# Patient Record
Sex: Male | Born: 1940 | Race: White | Hispanic: No
Health system: Southern US, Community
[De-identification: ages and names within clinical notes are randomized; demographics above are authoritative.]

---

## 2007-10-24 ENCOUNTER — Ambulatory Visit: Payer: Self-pay | Admitting: Neurology

## 2008-06-22 ENCOUNTER — Ambulatory Visit: Payer: Self-pay | Admitting: Internal Medicine

## 2008-06-26 ENCOUNTER — Ambulatory Visit: Payer: Self-pay | Admitting: Internal Medicine

## 2008-07-04 ENCOUNTER — Ambulatory Visit: Payer: Self-pay | Admitting: Gastroenterology

## 2010-10-20 ENCOUNTER — Ambulatory Visit: Payer: Self-pay | Admitting: Internal Medicine

## 2010-11-09 ENCOUNTER — Ambulatory Visit: Payer: Self-pay | Admitting: Internal Medicine

## 2010-12-24 ENCOUNTER — Ambulatory Visit: Payer: Self-pay | Admitting: Internal Medicine

## 2012-02-29 ENCOUNTER — Ambulatory Visit: Payer: Self-pay | Admitting: Surgery

## 2012-03-21 ENCOUNTER — Ambulatory Visit: Payer: Self-pay | Admitting: Gastroenterology

## 2012-09-27 ENCOUNTER — Ambulatory Visit: Payer: Self-pay | Admitting: Surgery

## 2012-10-03 ENCOUNTER — Ambulatory Visit: Payer: Self-pay | Admitting: Surgery

## 2013-05-18 ENCOUNTER — Other Ambulatory Visit: Payer: Self-pay

## 2013-05-18 ENCOUNTER — Inpatient Hospital Stay: Payer: Self-pay | Admitting: Internal Medicine

## 2013-05-18 LAB — COMPREHENSIVE METABOLIC PANEL
ALK PHOS: 110 U/L
Albumin: 3.3 g/dL — ABNORMAL LOW (ref 3.4–5.0)
Anion Gap: 5 — ABNORMAL LOW (ref 7–16)
BUN: 18 mg/dL (ref 7–18)
Bilirubin,Total: 1.3 mg/dL — ABNORMAL HIGH (ref 0.2–1.0)
CO2: 34 mmol/L — AB (ref 21–32)
Calcium, Total: 9.1 mg/dL (ref 8.5–10.1)
Chloride: 100 mmol/L (ref 98–107)
Creatinine: 1.21 mg/dL (ref 0.60–1.30)
EGFR (African American): >60
EGFR (Non-African Amer.): >60
GLUCOSE: 99 mg/dL (ref 65–99)
Osmolality: 279 (ref 275–301)
Potassium: 3.6 mmol/L (ref 3.5–5.1)
SGOT(AST): 27 U/L (ref 15–37)
SGPT (ALT): 29 U/L (ref 12–78)
SODIUM: 139 mmol/L (ref 136–145)
Total Protein: 7.2 g/dL (ref 6.4–8.2)

## 2013-05-18 LAB — BASIC METABOLIC PANEL
Anion Gap: 8 (ref 7–16)
BUN: 19 mg/dL — AB (ref 7–18)
CALCIUM: 8.4 mg/dL — AB (ref 8.5–10.1)
CHLORIDE: 100 mmol/L (ref 98–107)
CO2: 29 mmol/L (ref 21–32)
Creatinine: 1.21 mg/dL (ref 0.60–1.30)
EGFR (African American): >60
EGFR (Non-African Amer.): >60
Glucose: 121 mg/dL — ABNORMAL HIGH (ref 65–99)
Osmolality: 277 (ref 275–301)
POTASSIUM: 3.4 mmol/L — AB (ref 3.5–5.1)
SODIUM: 137 mmol/L (ref 136–145)

## 2013-05-18 LAB — PRO B NATRIURETIC PEPTIDE: B-TYPE NATIURETIC PEPTID: 19790 pg/mL — AB (ref 0–125)

## 2013-05-18 LAB — CBC
HCT: 39.7 % — ABNORMAL LOW (ref 40.0–52.0)
HGB: 12.5 g/dL — ABNORMAL LOW (ref 13.0–18.0)
MCH: 27.9 pg (ref 26.0–34.0)
MCHC: 31.5 g/dL — ABNORMAL LOW (ref 32.0–36.0)
MCV: 89 fL (ref 80–100)
Platelet: 156 10*3/uL (ref 150–440)
RBC: 4.47 10*6/uL (ref 4.40–5.90)
RDW: 15.9 % — ABNORMAL HIGH (ref 11.5–14.5)
WBC: 15.9 10*3/uL — ABNORMAL HIGH (ref 3.8–10.6)

## 2013-05-18 LAB — TROPONIN I: TROPONIN-I: 0.02 ng/mL

## 2013-05-18 LAB — PROTIME-INR
INR: 1.5
Prothrombin Time: 17.6 secs — ABNORMAL HIGH (ref 11.5–14.7)

## 2013-05-19 ENCOUNTER — Ambulatory Visit: Payer: Self-pay | Admitting: Neurology

## 2013-05-19 LAB — BASIC METABOLIC PANEL
Anion Gap: 5 — ABNORMAL LOW (ref 7–16)
BUN: 17 mg/dL (ref 7–18)
CO2: 33 mmol/L — AB (ref 21–32)
Calcium, Total: 8.7 mg/dL (ref 8.5–10.1)
Chloride: 101 mmol/L (ref 98–107)
Creatinine: 1.14 mg/dL (ref 0.60–1.30)
GLUCOSE: 134 mg/dL — AB (ref 65–99)
OSMOLALITY: 281 (ref 275–301)
Potassium: 3.4 mmol/L — ABNORMAL LOW (ref 3.5–5.1)
SODIUM: 139 mmol/L (ref 136–145)

## 2013-05-19 LAB — MAGNESIUM
Magnesium: 2.2 mg/dL
Magnesium: 2.2 mg/dL

## 2013-05-19 LAB — TSH: Thyroid Stimulating Horm: 1.58 u[IU]/mL

## 2013-05-19 LAB — TROPONIN I: Troponin-I: <0.02 ng/mL

## 2013-05-20 LAB — BASIC METABOLIC PANEL
ANION GAP: 6 — AB (ref 7–16)
BUN: 18 mg/dL (ref 7–18)
CHLORIDE: 104 mmol/L (ref 98–107)
CREATININE: 1.06 mg/dL (ref 0.60–1.30)
Calcium, Total: 8.8 mg/dL (ref 8.5–10.1)
Co2: 31 mmol/L (ref 21–32)
EGFR (Non-African Amer.): >60
Glucose: 98 mg/dL (ref 65–99)
Osmolality: 283 (ref 275–301)
Potassium: 3.3 mmol/L — ABNORMAL LOW (ref 3.5–5.1)
Sodium: 141 mmol/L (ref 136–145)

## 2013-05-20 LAB — CBC WITH DIFFERENTIAL/PLATELET
BASOS ABS: 0 10*3/uL (ref 0.0–0.1)
BASOS PCT: 0.3 %
EOS ABS: 0 10*3/uL (ref 0.0–0.7)
Eosinophil %: 0.4 %
HCT: 37.4 % — AB (ref 40.0–52.0)
HGB: 12.2 g/dL — ABNORMAL LOW (ref 13.0–18.0)
LYMPHS PCT: 9 %
Lymphocyte #: 1 10*3/uL (ref 1.0–3.6)
MCH: 28.9 pg (ref 26.0–34.0)
MCHC: 32.6 g/dL (ref 32.0–36.0)
MCV: 89 fL (ref 80–100)
MONOS PCT: 5.2 %
Monocyte #: 0.6 x10 3/mm (ref 0.2–1.0)
Neutrophil #: 9.8 10*3/uL — ABNORMAL HIGH (ref 1.4–6.5)
Neutrophil %: 85.1 %
Platelet: 130 10*3/uL — ABNORMAL LOW (ref 150–440)
RBC: 4.22 10*6/uL — ABNORMAL LOW (ref 4.40–5.90)
RDW: 16 % — AB (ref 11.5–14.5)
WBC: 11.5 10*3/uL — ABNORMAL HIGH (ref 3.8–10.6)

## 2013-05-21 LAB — CBC WITH DIFFERENTIAL/PLATELET
BASOS ABS: 0 10*3/uL (ref 0.0–0.1)
Basophil %: 0.4 %
Eosinophil #: 0.2 10*3/uL (ref 0.0–0.7)
Eosinophil %: 1.9 %
HCT: 37.8 % — AB (ref 40.0–52.0)
HGB: 12.2 g/dL — AB (ref 13.0–18.0)
LYMPHS ABS: 0.9 10*3/uL — AB (ref 1.0–3.6)
Lymphocyte %: 8.6 %
MCH: 29 pg (ref 26.0–34.0)
MCHC: 32.4 g/dL (ref 32.0–36.0)
MCV: 90 fL (ref 80–100)
MONO ABS: 0.6 x10 3/mm (ref 0.2–1.0)
Monocyte %: 5.4 %
Neutrophil #: 9.1 10*3/uL — ABNORMAL HIGH (ref 1.4–6.5)
Neutrophil %: 83.7 %
Platelet: 127 10*3/uL — ABNORMAL LOW (ref 150–440)
RBC: 4.23 10*6/uL — ABNORMAL LOW (ref 4.40–5.90)
RDW: 16 % — ABNORMAL HIGH (ref 11.5–14.5)
WBC: 10.8 10*3/uL — ABNORMAL HIGH (ref 3.8–10.6)

## 2013-05-21 LAB — BASIC METABOLIC PANEL
ANION GAP: 5 — AB (ref 7–16)
BUN: 13 mg/dL (ref 7–18)
CREATININE: 0.98 mg/dL (ref 0.60–1.30)
Calcium, Total: 8.9 mg/dL (ref 8.5–10.1)
Chloride: 105 mmol/L (ref 98–107)
Co2: 31 mmol/L (ref 21–32)
EGFR (African American): >60
GLUCOSE: 94 mg/dL (ref 65–99)
Osmolality: 281 (ref 275–301)
Potassium: 3.8 mmol/L (ref 3.5–5.1)
Sodium: 141 mmol/L (ref 136–145)

## 2013-05-22 LAB — CBC WITH DIFFERENTIAL/PLATELET
BASOS ABS: 0 10*3/uL (ref 0.0–0.1)
Basophil %: 0 %
EOS PCT: 0 %
Eosinophil #: 0 10*3/uL (ref 0.0–0.7)
HCT: 38.8 % — AB (ref 40.0–52.0)
HGB: 12.5 g/dL — ABNORMAL LOW (ref 13.0–18.0)
Lymphocyte #: 0.4 10*3/uL — ABNORMAL LOW (ref 1.0–3.6)
Lymphocyte %: 4.7 %
MCH: 28.8 pg (ref 26.0–34.0)
MCHC: 32.3 g/dL (ref 32.0–36.0)
MCV: 89 fL (ref 80–100)
MONO ABS: 0.2 x10 3/mm (ref 0.2–1.0)
MONOS PCT: 2 %
NEUTROS ABS: 7.6 10*3/uL — AB (ref 1.4–6.5)
NEUTROS PCT: 93.3 %
PLATELETS: 117 10*3/uL — AB (ref 150–440)
RBC: 4.34 10*6/uL — AB (ref 4.40–5.90)
RDW: 16.3 % — AB (ref 11.5–14.5)
WBC: 8.1 10*3/uL (ref 3.8–10.6)

## 2013-05-22 LAB — BASIC METABOLIC PANEL
ANION GAP: 6 — AB (ref 7–16)
BUN: 16 mg/dL (ref 7–18)
CALCIUM: 8.9 mg/dL (ref 8.5–10.1)
CHLORIDE: 105 mmol/L (ref 98–107)
CO2: 30 mmol/L (ref 21–32)
Creatinine: 0.97 mg/dL (ref 0.60–1.30)
EGFR (Non-African Amer.): >60
Glucose: 146 mg/dL — ABNORMAL HIGH (ref 65–99)
OSMOLALITY: 285 (ref 275–301)
POTASSIUM: 4.6 mmol/L (ref 3.5–5.1)
Sodium: 141 mmol/L (ref 136–145)

## 2013-05-23 LAB — BASIC METABOLIC PANEL
ANION GAP: 5 — AB (ref 7–16)
BUN: 23 mg/dL — ABNORMAL HIGH (ref 7–18)
CALCIUM: 9.2 mg/dL (ref 8.5–10.1)
CHLORIDE: 105 mmol/L (ref 98–107)
Co2: 30 mmol/L (ref 21–32)
Creatinine: 0.98 mg/dL (ref 0.60–1.30)
EGFR (Non-African Amer.): >60
Glucose: 163 mg/dL — ABNORMAL HIGH (ref 65–99)
Osmolality: 287 (ref 275–301)
POTASSIUM: 4.5 mmol/L (ref 3.5–5.1)
SODIUM: 140 mmol/L (ref 136–145)

## 2013-05-24 ENCOUNTER — Observation Stay: Payer: Self-pay | Admitting: Internal Medicine

## 2013-05-24 LAB — CBC WITH DIFFERENTIAL/PLATELET
BASOS ABS: 0 10*3/uL (ref 0.0–0.1)
Basophil %: 0.2 %
EOS PCT: 0 %
Eosinophil #: 0 10*3/uL (ref 0.0–0.7)
HCT: 44 % (ref 40.0–52.0)
HGB: 13.7 g/dL (ref 13.0–18.0)
Lymphocyte #: 0.3 10*3/uL — ABNORMAL LOW (ref 1.0–3.6)
Lymphocyte %: 2.2 %
MCH: 27.9 pg (ref 26.0–34.0)
MCHC: 31.2 g/dL — ABNORMAL LOW (ref 32.0–36.0)
MCV: 89 fL (ref 80–100)
Monocyte #: 0.4 x10 3/mm (ref 0.2–1.0)
Monocyte %: 2.6 %
NEUTROS ABS: 13.9 10*3/uL — AB (ref 1.4–6.5)
Neutrophil %: 95 %
Platelet: 156 10*3/uL (ref 150–440)
RBC: 4.92 10*6/uL (ref 4.40–5.90)
RDW: 16.6 % — AB (ref 11.5–14.5)
WBC: 14.7 10*3/uL — AB (ref 3.8–10.6)

## 2013-05-24 LAB — BASIC METABOLIC PANEL
Anion Gap: 5 — ABNORMAL LOW (ref 7–16)
BUN: 28 mg/dL — ABNORMAL HIGH (ref 7–18)
CHLORIDE: 103 mmol/L (ref 98–107)
Calcium, Total: 9.7 mg/dL (ref 8.5–10.1)
Co2: 32 mmol/L (ref 21–32)
Creatinine: 0.84 mg/dL (ref 0.60–1.30)
EGFR (African American): >60
EGFR (Non-African Amer.): >60
GLUCOSE: 173 mg/dL — AB (ref 65–99)
Osmolality: 289 (ref 275–301)
POTASSIUM: 4.2 mmol/L (ref 3.5–5.1)
SODIUM: 140 mmol/L (ref 136–145)

## 2013-05-24 LAB — URINALYSIS, COMPLETE
BACTERIA: NONE SEEN
Bilirubin,UR: NEGATIVE
Blood: NEGATIVE
Glucose,UR: NEGATIVE mg/dL (ref 0–75)
Ketone: NEGATIVE
Leukocyte Esterase: NEGATIVE
NITRITE: NEGATIVE
PH: 6 (ref 4.5–8.0)
RBC,UR: <1 /HPF (ref 0–5)
Specific Gravity: 1.01 (ref 1.003–1.030)
Squamous Epithelial: <1
WBC UR: 2 /HPF (ref 0–5)

## 2013-05-24 LAB — PRO B NATRIURETIC PEPTIDE: B-Type Natriuretic Peptide: 16783 pg/mL — ABNORMAL HIGH (ref 0–125)

## 2013-05-24 LAB — TROPONIN I
TROPONIN-I: 0.04 ng/mL
Troponin-I: 0.05 ng/mL

## 2013-05-24 LAB — CK-MB
CK-MB: 2.3 ng/mL (ref 0.5–3.6)
CK-MB: 2 ng/mL (ref 0.5–3.6)

## 2013-05-24 LAB — MAGNESIUM: MAGNESIUM: 2.5 mg/dL — AB

## 2013-05-24 LAB — LIPASE, BLOOD: Lipase: 144 U/L (ref 73–393)

## 2013-05-25 LAB — BASIC METABOLIC PANEL
ANION GAP: 5 — AB (ref 7–16)
BUN: 26 mg/dL — AB (ref 7–18)
CALCIUM: 9.2 mg/dL (ref 8.5–10.1)
CHLORIDE: 106 mmol/L (ref 98–107)
Co2: 30 mmol/L (ref 21–32)
Creatinine: 0.79 mg/dL (ref 0.60–1.30)
EGFR (African American): >60
Glucose: 166 mg/dL — ABNORMAL HIGH (ref 65–99)
Osmolality: 290 (ref 275–301)
POTASSIUM: 4.1 mmol/L (ref 3.5–5.1)
Sodium: 141 mmol/L (ref 136–145)

## 2013-05-25 LAB — CBC WITH DIFFERENTIAL/PLATELET
Basophil #: 0 10*3/uL (ref 0.0–0.1)
Basophil %: 0.1 %
EOS ABS: 0 10*3/uL (ref 0.0–0.7)
Eosinophil %: 0 %
HCT: 44 % (ref 40.0–52.0)
HGB: 13.7 g/dL (ref 13.0–18.0)
LYMPHS ABS: 0.4 10*3/uL — AB (ref 1.0–3.6)
LYMPHS PCT: 2.3 %
MCH: 28.1 pg (ref 26.0–34.0)
MCHC: 31.2 g/dL — AB (ref 32.0–36.0)
MCV: 90 fL (ref 80–100)
Monocyte #: 0.5 x10 3/mm (ref 0.2–1.0)
Monocyte %: 3.4 %
NEUTROS PCT: 94.2 %
Neutrophil #: 14.1 10*3/uL — ABNORMAL HIGH (ref 1.4–6.5)
PLATELETS: 146 10*3/uL — AB (ref 150–440)
RBC: 4.88 10*6/uL (ref 4.40–5.90)
RDW: 16.3 % — AB (ref 11.5–14.5)
WBC: 15 10*3/uL — ABNORMAL HIGH (ref 3.8–10.6)

## 2013-05-25 LAB — TROPONIN I: Troponin-I: 0.06 ng/mL — ABNORMAL HIGH

## 2013-05-25 LAB — MAGNESIUM: Magnesium: 2.4 mg/dL

## 2013-05-25 LAB — CK-MB: CK-MB: 1.8 ng/mL (ref 0.5–3.6)

## 2013-05-26 LAB — COMPREHENSIVE METABOLIC PANEL
ANION GAP: 12 (ref 7–16)
Albumin: 3.6 g/dL (ref 3.4–5.0)
Alkaline Phosphatase: 121 U/L — ABNORMAL HIGH
BILIRUBIN TOTAL: 1.1 mg/dL — AB (ref 0.2–1.0)
BUN: 40 mg/dL — ABNORMAL HIGH (ref 7–18)
CO2: 28 mmol/L (ref 21–32)
CREATININE: 1.13 mg/dL (ref 0.60–1.30)
Calcium, Total: 9.1 mg/dL (ref 8.5–10.1)
Chloride: 104 mmol/L (ref 98–107)
EGFR (Non-African Amer.): >60
Glucose: 150 mg/dL — ABNORMAL HIGH (ref 65–99)
OSMOLALITY: 299 (ref 275–301)
Potassium: 3.6 mmol/L (ref 3.5–5.1)
SGOT(AST): 125 U/L — ABNORMAL HIGH (ref 15–37)
SGPT (ALT): 178 U/L — ABNORMAL HIGH (ref 12–78)
SODIUM: 144 mmol/L (ref 136–145)
Total Protein: 7.4 g/dL (ref 6.4–8.2)

## 2013-05-26 LAB — APTT: Activated PTT: 26.9 secs (ref 23.6–35.9)

## 2013-05-26 LAB — CBC
HCT: 44.7 % (ref 40.0–52.0)
HGB: 13.9 g/dL (ref 13.0–18.0)
MCH: 27.9 pg (ref 26.0–34.0)
MCHC: 31 g/dL — ABNORMAL LOW (ref 32.0–36.0)
MCV: 90 fL (ref 80–100)
PLATELETS: 149 10*3/uL — AB (ref 150–440)
RBC: 4.97 10*6/uL (ref 4.40–5.90)
RDW: 16.5 % — ABNORMAL HIGH (ref 11.5–14.5)
WBC: 18.6 10*3/uL — ABNORMAL HIGH (ref 3.8–10.6)

## 2013-05-26 LAB — URINALYSIS, COMPLETE
Bacteria: NONE SEEN
Bilirubin,UR: NEGATIVE
Glucose,UR: NEGATIVE mg/dL (ref 0–75)
Hyaline Cast: 14
Ketone: NEGATIVE
LEUKOCYTE ESTERASE: NEGATIVE
Nitrite: NEGATIVE
Ph: 5 (ref 4.5–8.0)
Protein: NEGATIVE
SQUAMOUS EPITHELIAL: NONE SEEN
Specific Gravity: 1.009 (ref 1.003–1.030)

## 2013-05-26 LAB — PROTIME-INR
INR: 1.5
Prothrombin Time: 17.7 secs — ABNORMAL HIGH (ref 11.5–14.7)

## 2013-05-26 LAB — AMMONIA: Ammonia, Plasma: 35 mcmol/L — ABNORMAL HIGH (ref 11–32)

## 2013-05-26 LAB — PRO B NATRIURETIC PEPTIDE: B-Type Natriuretic Peptide: 21509 pg/mL — ABNORMAL HIGH (ref 0–125)

## 2013-05-27 ENCOUNTER — Inpatient Hospital Stay: Payer: Self-pay | Admitting: Internal Medicine

## 2013-05-27 LAB — CBC WITH DIFFERENTIAL/PLATELET
BASOS ABS: 0 10*3/uL (ref 0.0–0.1)
BASOS PCT: 0.1 %
EOS ABS: 0.2 10*3/uL (ref 0.0–0.7)
EOS PCT: 1.2 %
HCT: 41.4 % (ref 40.0–52.0)
HGB: 12.9 g/dL — ABNORMAL LOW (ref 13.0–18.0)
Lymphocyte #: 0.9 10*3/uL — ABNORMAL LOW (ref 1.0–3.6)
Lymphocyte %: 6.5 %
MCH: 27.7 pg (ref 26.0–34.0)
MCHC: 31.3 g/dL — ABNORMAL LOW (ref 32.0–36.0)
MCV: 89 fL (ref 80–100)
Monocyte #: 0.4 x10 3/mm (ref 0.2–1.0)
Monocyte %: 3.3 %
NEUTROS ABS: 11.9 10*3/uL — AB (ref 1.4–6.5)
Neutrophil %: 88.9 %
PLATELETS: 129 10*3/uL — AB (ref 150–440)
RBC: 4.66 10*6/uL (ref 4.40–5.90)
RDW: 16.9 % — ABNORMAL HIGH (ref 11.5–14.5)
WBC: 13.3 10*3/uL — ABNORMAL HIGH (ref 3.8–10.6)

## 2013-05-27 LAB — CK-MB
CK-MB: 1.8 ng/mL (ref 0.5–3.6)
CK-MB: 2.2 ng/mL (ref 0.5–3.6)
CK-MB: 2.6 ng/mL (ref 0.5–3.6)

## 2013-05-27 LAB — BASIC METABOLIC PANEL
ANION GAP: 8 (ref 7–16)
BUN: 38 mg/dL — ABNORMAL HIGH (ref 7–18)
CO2: 31 mmol/L (ref 21–32)
Calcium, Total: 8.8 mg/dL (ref 8.5–10.1)
Chloride: 106 mmol/L (ref 98–107)
Creatinine: 1 mg/dL (ref 0.60–1.30)
EGFR (African American): >60
EGFR (Non-African Amer.): >60
Glucose: 98 mg/dL (ref 65–99)
Osmolality: 298 (ref 275–301)
Potassium: 3.1 mmol/L — ABNORMAL LOW (ref 3.5–5.1)
SODIUM: 145 mmol/L (ref 136–145)

## 2013-05-27 LAB — TROPONIN I
TROPONIN-I: 0.12 ng/mL — AB
Troponin-I: 0.08 ng/mL — ABNORMAL HIGH
Troponin-I: 0.11 ng/mL — ABNORMAL HIGH

## 2013-05-27 LAB — HEPATIC FUNCTION PANEL A (ARMC)
ALBUMIN: 3.3 g/dL — AB (ref 3.4–5.0)
ALK PHOS: 122 U/L — AB
BILIRUBIN TOTAL: 0.8 mg/dL (ref 0.2–1.0)
Bilirubin, Direct: 0.5 mg/dL — ABNORMAL HIGH (ref 0.00–0.20)
SGOT(AST): 99 U/L — ABNORMAL HIGH (ref 15–37)
SGPT (ALT): 165 U/L — ABNORMAL HIGH (ref 12–78)
Total Protein: 6.7 g/dL (ref 6.4–8.2)

## 2013-05-28 LAB — CBC WITH DIFFERENTIAL/PLATELET
Basophil #: 0.1 10*3/uL (ref 0.0–0.1)
Basophil %: 0.5 %
Eosinophil #: 0.4 10*3/uL (ref 0.0–0.7)
Eosinophil %: 3 %
HCT: 42.2 % (ref 40.0–52.0)
HGB: 13.4 g/dL (ref 13.0–18.0)
LYMPHS ABS: 1 10*3/uL (ref 1.0–3.6)
LYMPHS PCT: 7.3 %
MCH: 28.5 pg (ref 26.0–34.0)
MCHC: 31.9 g/dL — AB (ref 32.0–36.0)
MCV: 89 fL (ref 80–100)
Monocyte #: 0.6 x10 3/mm (ref 0.2–1.0)
Monocyte %: 4.1 %
Neutrophil #: 11.8 10*3/uL — ABNORMAL HIGH (ref 1.4–6.5)
Neutrophil %: 85.1 %
Platelet: 118 10*3/uL — ABNORMAL LOW (ref 150–440)
RBC: 4.72 10*6/uL (ref 4.40–5.90)
RDW: 16.7 % — ABNORMAL HIGH (ref 11.5–14.5)
WBC: 13.9 10*3/uL — AB (ref 3.8–10.6)

## 2013-05-28 LAB — HEPATIC FUNCTION PANEL A (ARMC)
ALK PHOS: 97 U/L
ALT: 145 U/L — AB (ref 12–78)
Albumin: 2.9 g/dL — ABNORMAL LOW (ref 3.4–5.0)
BILIRUBIN DIRECT: 0.4 mg/dL — AB (ref 0.00–0.20)
Bilirubin,Total: 1.3 mg/dL — ABNORMAL HIGH (ref 0.2–1.0)
SGOT(AST): 79 U/L — ABNORMAL HIGH (ref 15–37)
Total Protein: 6.4 g/dL (ref 6.4–8.2)

## 2013-05-29 LAB — CBC WITH DIFFERENTIAL/PLATELET
Basophil #: 0 10*3/uL (ref 0.0–0.1)
Basophil %: 0.1 %
EOS ABS: 0.4 10*3/uL (ref 0.0–0.7)
Eosinophil %: 3.3 %
HCT: 40.9 % (ref 40.0–52.0)
HGB: 13.3 g/dL (ref 13.0–18.0)
Lymphocyte #: 1.1 10*3/uL (ref 1.0–3.6)
Lymphocyte %: 8.2 %
MCH: 28.8 pg (ref 26.0–34.0)
MCHC: 32.6 g/dL (ref 32.0–36.0)
MCV: 88 fL (ref 80–100)
MONO ABS: 0.5 x10 3/mm (ref 0.2–1.0)
Monocyte %: 4 %
NEUTROS PCT: 84.4 %
Neutrophil #: 10.9 10*3/uL — ABNORMAL HIGH (ref 1.4–6.5)
Platelet: 107 10*3/uL — ABNORMAL LOW (ref 150–440)
RBC: 4.63 10*6/uL (ref 4.40–5.90)
RDW: 16.3 % — AB (ref 11.5–14.5)
WBC: 12.9 10*3/uL — ABNORMAL HIGH (ref 3.8–10.6)

## 2013-05-29 LAB — BASIC METABOLIC PANEL
Anion Gap: 4 — ABNORMAL LOW (ref 7–16)
BUN: 20 mg/dL — ABNORMAL HIGH (ref 7–18)
Calcium, Total: 8.4 mg/dL — ABNORMAL LOW (ref 8.5–10.1)
Chloride: 106 mmol/L (ref 98–107)
Co2: 35 mmol/L — ABNORMAL HIGH (ref 21–32)
Creatinine: 1.01 mg/dL (ref 0.60–1.30)
EGFR (African American): >60
EGFR (Non-African Amer.): >60
GLUCOSE: 98 mg/dL (ref 65–99)
OSMOLALITY: 291 (ref 275–301)
POTASSIUM: 3.3 mmol/L — AB (ref 3.5–5.1)
SODIUM: 145 mmol/L (ref 136–145)

## 2013-05-29 LAB — HEPATIC FUNCTION PANEL A (ARMC)
ALBUMIN: 3.1 g/dL — AB (ref 3.4–5.0)
ALK PHOS: 97 U/L
AST: 54 U/L — AB (ref 15–37)
BILIRUBIN DIRECT: 0.5 mg/dL — AB (ref 0.00–0.20)
Bilirubin,Total: 1.3 mg/dL — ABNORMAL HIGH (ref 0.2–1.0)
SGPT (ALT): 112 U/L — ABNORMAL HIGH (ref 12–78)
Total Protein: 6.3 g/dL — ABNORMAL LOW (ref 6.4–8.2)

## 2013-05-30 LAB — CBC WITH DIFFERENTIAL/PLATELET
BASOS ABS: 0 10*3/uL (ref 0.0–0.1)
Basophil %: 0.1 %
Eosinophil #: 0.3 10*3/uL (ref 0.0–0.7)
Eosinophil %: 2.6 %
HCT: 40.2 % (ref 40.0–52.0)
HGB: 13 g/dL (ref 13.0–18.0)
Lymphocyte #: 1.1 10*3/uL (ref 1.0–3.6)
Lymphocyte %: 9.9 %
MCH: 28.8 pg (ref 26.0–34.0)
MCHC: 32.3 g/dL (ref 32.0–36.0)
MCV: 89 fL (ref 80–100)
MONOS PCT: 4.5 %
Monocyte #: 0.5 x10 3/mm (ref 0.2–1.0)
Neutrophil #: 9.5 10*3/uL — ABNORMAL HIGH (ref 1.4–6.5)
Neutrophil %: 82.9 %
PLATELETS: 103 10*3/uL — AB (ref 150–440)
RBC: 4.51 10*6/uL (ref 4.40–5.90)
RDW: 17 % — ABNORMAL HIGH (ref 11.5–14.5)
WBC: 11.5 10*3/uL — ABNORMAL HIGH (ref 3.8–10.6)

## 2013-05-30 LAB — BASIC METABOLIC PANEL
ANION GAP: 2 — AB (ref 7–16)
BUN: 18 mg/dL (ref 7–18)
CREATININE: 1.09 mg/dL (ref 0.60–1.30)
Calcium, Total: 8.9 mg/dL (ref 8.5–10.1)
Chloride: 106 mmol/L (ref 98–107)
Co2: 33 mmol/L — ABNORMAL HIGH (ref 21–32)
EGFR (African American): >60
EGFR (Non-African Amer.): >60
GLUCOSE: 130 mg/dL — AB (ref 65–99)
Osmolality: 285 (ref 275–301)
Potassium: 3.9 mmol/L (ref 3.5–5.1)
Sodium: 141 mmol/L (ref 136–145)

## 2013-05-30 LAB — HEPATIC FUNCTION PANEL A (ARMC)
ALK PHOS: 115 U/L
ALT: 96 U/L — AB (ref 12–78)
AST: 46 U/L — AB (ref 15–37)
Albumin: 3.2 g/dL — ABNORMAL LOW (ref 3.4–5.0)
BILIRUBIN DIRECT: 0.5 mg/dL — AB (ref 0.00–0.20)
BILIRUBIN TOTAL: 1.1 mg/dL — AB (ref 0.2–1.0)
TOTAL PROTEIN: 6.9 g/dL (ref 6.4–8.2)

## 2013-05-31 LAB — CULTURE, BLOOD (SINGLE)

## 2013-05-31 LAB — PATHOLOGY REPORT

## 2013-06-08 ENCOUNTER — Other Ambulatory Visit: Payer: Self-pay | Admitting: Family Medicine

## 2013-06-08 LAB — URINALYSIS, COMPLETE
BILIRUBIN, UR: NEGATIVE
Bacteria: NONE SEEN
Blood: NEGATIVE
Glucose,UR: NEGATIVE mg/dL (ref 0–75)
Hyaline Cast: 2
KETONE: NEGATIVE
NITRITE: NEGATIVE
PROTEIN: NEGATIVE
Ph: 5 (ref 4.5–8.0)
RBC,UR: 1 /HPF (ref 0–5)
SPECIFIC GRAVITY: 1.012 (ref 1.003–1.030)
Squamous Epithelial: <1
WBC UR: 4 /HPF (ref 0–5)

## 2013-06-10 LAB — URINE CULTURE

## 2013-06-25 ENCOUNTER — Other Ambulatory Visit: Payer: Self-pay | Admitting: Family Medicine

## 2013-06-25 LAB — URINALYSIS, COMPLETE
BACTERIA: NONE SEEN
Bilirubin,UR: NEGATIVE
Blood: NEGATIVE
Glucose,UR: NEGATIVE mg/dL (ref 0–75)
Hyaline Cast: 16
KETONE: NEGATIVE
NITRITE: NEGATIVE
PROTEIN: NEGATIVE
Ph: 5 (ref 4.5–8.0)
RBC,UR: 2 /HPF (ref 0–5)
SPECIFIC GRAVITY: 1.017 (ref 1.003–1.030)
WBC UR: 11 /HPF (ref 0–5)

## 2013-06-27 LAB — URINE CULTURE

## 2013-06-28 LAB — COMPREHENSIVE METABOLIC PANEL
ALT: 23 U/L (ref 12–78)
ANION GAP: 8 (ref 7–16)
AST: 29 U/L (ref 15–37)
Albumin: 3.1 g/dL — ABNORMAL LOW (ref 3.4–5.0)
Alkaline Phosphatase: 95 U/L
BILIRUBIN TOTAL: 1.1 mg/dL — AB (ref 0.2–1.0)
BUN: 21 mg/dL — ABNORMAL HIGH (ref 7–18)
CREATININE: 1.13 mg/dL (ref 0.60–1.30)
Calcium, Total: 8.7 mg/dL (ref 8.5–10.1)
Chloride: 109 mmol/L — ABNORMAL HIGH (ref 98–107)
Co2: 25 mmol/L (ref 21–32)
EGFR (African American): >60
EGFR (Non-African Amer.): >60
GLUCOSE: 127 mg/dL — AB (ref 65–99)
OSMOLALITY: 288 (ref 275–301)
POTASSIUM: 3.4 mmol/L — AB (ref 3.5–5.1)
Sodium: 142 mmol/L (ref 136–145)
Total Protein: 6.6 g/dL (ref 6.4–8.2)

## 2013-06-28 LAB — URINALYSIS, COMPLETE
Bacteria: NONE SEEN
Bilirubin,UR: NEGATIVE
Blood: NEGATIVE
Glucose,UR: NEGATIVE mg/dL (ref 0–75)
Ketone: NEGATIVE
Leukocyte Esterase: NEGATIVE
NITRITE: NEGATIVE
PH: 5 (ref 4.5–8.0)
Protein: NEGATIVE
Specific Gravity: 1.017 (ref 1.003–1.030)
Squamous Epithelial: NONE SEEN
WBC UR: 1 /HPF (ref 0–5)

## 2013-06-28 LAB — CK TOTAL AND CKMB (NOT AT ARMC)
CK, Total: 134 U/L
CK-MB: 4.3 ng/mL — ABNORMAL HIGH (ref 0.5–3.6)

## 2013-06-28 LAB — CBC
HCT: 36 % — ABNORMAL LOW (ref 40.0–52.0)
HGB: 11.5 g/dL — ABNORMAL LOW (ref 13.0–18.0)
MCH: 28.6 pg (ref 26.0–34.0)
MCHC: 31.9 g/dL — ABNORMAL LOW (ref 32.0–36.0)
MCV: 90 fL (ref 80–100)
Platelet: 126 10*3/uL — ABNORMAL LOW (ref 150–440)
RBC: 4.02 10*6/uL — ABNORMAL LOW (ref 4.40–5.90)
RDW: 19.1 % — AB (ref 11.5–14.5)
WBC: 10.8 10*3/uL — ABNORMAL HIGH (ref 3.8–10.6)

## 2013-06-28 LAB — TROPONIN I: TROPONIN-I: 0.04 ng/mL

## 2013-06-29 ENCOUNTER — Inpatient Hospital Stay: Payer: Self-pay | Admitting: Internal Medicine

## 2013-06-29 LAB — CK TOTAL AND CKMB (NOT AT ARMC)
CK, Total: 75 U/L
CK, Total: 65 U/L
CK, Total: 55 U/L
CK, Total: 44 U/L
CK-MB: 3.1 ng/mL (ref 0.5–3.6)
CK-MB: 2.8 ng/mL (ref 0.5–3.6)
CK-MB: 2.7 ng/mL (ref 0.5–3.6)
CK-MB: 2.1 ng/mL (ref 0.5–3.6)

## 2013-06-29 LAB — TROPONIN I
Troponin-I: 0.03 ng/mL
Troponin-I: 0.04 ng/mL

## 2013-06-30 ENCOUNTER — Ambulatory Visit: Payer: Self-pay | Admitting: Neurology

## 2013-06-30 LAB — CBC WITH DIFFERENTIAL/PLATELET
BASOS PCT: 0.5 %
Basophil #: 0.1 10*3/uL (ref 0.0–0.1)
Eosinophil #: 0.2 10*3/uL (ref 0.0–0.7)
Eosinophil %: 1.4 %
HCT: 38.5 % — ABNORMAL LOW (ref 40.0–52.0)
HGB: 12.2 g/dL — ABNORMAL LOW (ref 13.0–18.0)
Lymphocyte #: 1.2 10*3/uL (ref 1.0–3.6)
Lymphocyte %: 9.6 %
MCH: 28.1 pg (ref 26.0–34.0)
MCHC: 31.7 g/dL — AB (ref 32.0–36.0)
MCV: 89 fL (ref 80–100)
Monocyte #: 0.8 x10 3/mm (ref 0.2–1.0)
Monocyte %: 6.4 %
NEUTROS ABS: 10.6 10*3/uL — AB (ref 1.4–6.5)
Neutrophil %: 82.1 %
Platelet: 137 10*3/uL — ABNORMAL LOW (ref 150–440)
RBC: 4.34 10*6/uL — ABNORMAL LOW (ref 4.40–5.90)
RDW: 19 % — ABNORMAL HIGH (ref 11.5–14.5)
WBC: 12.9 10*3/uL — ABNORMAL HIGH (ref 3.8–10.6)

## 2013-06-30 LAB — BASIC METABOLIC PANEL
ANION GAP: 6 — AB (ref 7–16)
BUN: 13 mg/dL (ref 7–18)
CALCIUM: 8.8 mg/dL (ref 8.5–10.1)
CREATININE: 1.01 mg/dL (ref 0.60–1.30)
Chloride: 108 mmol/L — ABNORMAL HIGH (ref 98–107)
Co2: 30 mmol/L (ref 21–32)
EGFR (African American): >60
Glucose: 103 mg/dL — ABNORMAL HIGH (ref 65–99)
Osmolality: 287 (ref 275–301)
Potassium: 3 mmol/L — ABNORMAL LOW (ref 3.5–5.1)
SODIUM: 144 mmol/L (ref 136–145)

## 2013-06-30 LAB — LIPID PANEL
CHOLESTEROL: 110 mg/dL (ref 0–200)
HDL Cholesterol: 28 mg/dL — ABNORMAL LOW (ref 40–60)
LDL CHOLESTEROL, CALC: 63 mg/dL (ref 0–100)
Triglycerides: 97 mg/dL (ref 0–200)
VLDL CHOLESTEROL, CALC: 19 mg/dL (ref 5–40)

## 2013-06-30 LAB — CK TOTAL AND CKMB (NOT AT ARMC)
CK, TOTAL: 29 U/L — AB
CK, TOTAL: 28 U/L — AB
CK-MB: 1.6 ng/mL (ref 0.5–3.6)
CK-MB: 1.2 ng/mL (ref 0.5–3.6)

## 2013-06-30 LAB — MAGNESIUM: MAGNESIUM: 2 mg/dL

## 2013-06-30 LAB — SEDIMENTATION RATE: ERYTHROCYTE SED RATE: 8 mm/h (ref 0–20)

## 2013-06-30 LAB — PRO B NATRIURETIC PEPTIDE: B-Type Natriuretic Peptide: 17264 pg/mL — ABNORMAL HIGH (ref 0–125)

## 2013-06-30 LAB — TSH: Thyroid Stimulating Horm: 2.7 u[IU]/mL

## 2013-07-01 LAB — CBC WITH DIFFERENTIAL/PLATELET
BASOS ABS: 0.1 10*3/uL (ref 0.0–0.1)
BASOS PCT: 0.7 %
Eosinophil #: 0.1 10*3/uL (ref 0.0–0.7)
Eosinophil %: 1.1 %
HCT: 40 % (ref 40.0–52.0)
HGB: 12.5 g/dL — AB (ref 13.0–18.0)
LYMPHS PCT: 9 %
Lymphocyte #: 1.2 10*3/uL (ref 1.0–3.6)
MCH: 28 pg (ref 26.0–34.0)
MCHC: 31.2 g/dL — ABNORMAL LOW (ref 32.0–36.0)
MCV: 90 fL (ref 80–100)
MONOS PCT: 8.1 %
Monocyte #: 1 x10 3/mm (ref 0.2–1.0)
Neutrophil #: 10.5 10*3/uL — ABNORMAL HIGH (ref 1.4–6.5)
Neutrophil %: 81.1 %
PLATELETS: 135 10*3/uL — AB (ref 150–440)
RBC: 4.46 10*6/uL (ref 4.40–5.90)
RDW: 19.4 % — ABNORMAL HIGH (ref 11.5–14.5)
WBC: 12.9 10*3/uL — ABNORMAL HIGH (ref 3.8–10.6)

## 2013-07-01 LAB — BASIC METABOLIC PANEL
ANION GAP: 3 — AB (ref 7–16)
BUN: 18 mg/dL (ref 7–18)
Calcium, Total: 8.7 mg/dL (ref 8.5–10.1)
Chloride: 108 mmol/L — ABNORMAL HIGH (ref 98–107)
Co2: 32 mmol/L (ref 21–32)
Creatinine: 0.99 mg/dL (ref 0.60–1.30)
EGFR (African American): >60
EGFR (Non-African Amer.): >60
Glucose: 97 mg/dL (ref 65–99)
Osmolality: 287 (ref 275–301)
POTASSIUM: 3.1 mmol/L — AB (ref 3.5–5.1)
Sodium: 143 mmol/L (ref 136–145)

## 2013-07-02 LAB — BASIC METABOLIC PANEL
Anion Gap: 6 — ABNORMAL LOW (ref 7–16)
BUN: 17 mg/dL (ref 7–18)
CO2: 28 mmol/L (ref 21–32)
Calcium, Total: 9 mg/dL (ref 8.5–10.1)
Chloride: 108 mmol/L — ABNORMAL HIGH (ref 98–107)
Creatinine: 0.72 mg/dL (ref 0.60–1.30)
EGFR (Non-African Amer.): >60
GLUCOSE: 105 mg/dL — AB (ref 65–99)
Osmolality: 285 (ref 275–301)
POTASSIUM: 3.5 mmol/L (ref 3.5–5.1)
Sodium: 142 mmol/L (ref 136–145)

## 2013-07-02 LAB — HEMOGLOBIN: HGB: 13.4 g/dL (ref 13.0–18.0)

## 2013-07-03 LAB — CBC WITH DIFFERENTIAL/PLATELET
BASOS ABS: 0.1 10*3/uL (ref 0.0–0.1)
Basophil %: 0.7 %
Eosinophil #: 0.3 10*3/uL (ref 0.0–0.7)
Eosinophil %: 2.3 %
HCT: 42.7 % (ref 40.0–52.0)
HGB: 13.4 g/dL (ref 13.0–18.0)
Lymphocyte #: 1.2 10*3/uL (ref 1.0–3.6)
Lymphocyte %: 10.2 %
MCH: 28.1 pg (ref 26.0–34.0)
MCHC: 31.4 g/dL — ABNORMAL LOW (ref 32.0–36.0)
MCV: 89 fL (ref 80–100)
MONOS PCT: 6.6 %
Monocyte #: 0.8 x10 3/mm (ref 0.2–1.0)
NEUTROS ABS: 9.3 10*3/uL — AB (ref 1.4–6.5)
Neutrophil %: 80.2 %
Platelet: 143 10*3/uL — ABNORMAL LOW (ref 150–440)
RBC: 4.78 10*6/uL (ref 4.40–5.90)
RDW: 19.5 % — AB (ref 11.5–14.5)
WBC: 11.6 10*3/uL — ABNORMAL HIGH (ref 3.8–10.6)

## 2013-07-03 LAB — DRUG SCREEN, URINE

## 2013-07-03 LAB — TSH: Thyroid Stimulating Horm: 2.65 u[IU]/mL

## 2013-07-03 LAB — BASIC METABOLIC PANEL
Anion Gap: 5 — ABNORMAL LOW (ref 7–16)
BUN: 17 mg/dL (ref 7–18)
CREATININE: 1.01 mg/dL (ref 0.60–1.30)
Calcium, Total: 9 mg/dL (ref 8.5–10.1)
Chloride: 108 mmol/L — ABNORMAL HIGH (ref 98–107)
Co2: 30 mmol/L (ref 21–32)
Glucose: 84 mg/dL (ref 65–99)
Osmolality: 286 (ref 275–301)
POTASSIUM: 3.3 mmol/L — AB (ref 3.5–5.1)
Sodium: 143 mmol/L (ref 136–145)

## 2013-07-05 ENCOUNTER — Other Ambulatory Visit: Payer: Self-pay | Admitting: Family Medicine

## 2013-07-05 LAB — CBC WITH DIFFERENTIAL/PLATELET
BASOS ABS: 0.1 10*3/uL (ref 0.0–0.1)
Basophil %: 0.5 %
EOS ABS: 0.3 10*3/uL (ref 0.0–0.7)
Eosinophil %: 2.6 %
HCT: 41.5 % (ref 40.0–52.0)
HGB: 13.1 g/dL (ref 13.0–18.0)
Lymphocyte #: 0.9 10*3/uL — ABNORMAL LOW (ref 1.0–3.6)
Lymphocyte %: 7.1 %
MCH: 28.3 pg (ref 26.0–34.0)
MCHC: 31.5 g/dL — AB (ref 32.0–36.0)
MCV: 90 fL (ref 80–100)
MONO ABS: 0.6 x10 3/mm (ref 0.2–1.0)
Monocyte %: 4.7 %
NEUTROS PCT: 85.1 %
Neutrophil #: 10.6 10*3/uL — ABNORMAL HIGH (ref 1.4–6.5)
PLATELETS: 157 10*3/uL (ref 150–440)
RBC: 4.61 10*6/uL (ref 4.40–5.90)
RDW: 19.2 % — AB (ref 11.5–14.5)
WBC: 12.5 10*3/uL — AB (ref 3.8–10.6)

## 2013-07-05 LAB — COMPREHENSIVE METABOLIC PANEL
ALBUMIN: 3.4 g/dL (ref 3.4–5.0)
ALT: 27 U/L (ref 12–78)
Alkaline Phosphatase: 96 U/L
Anion Gap: 6 — ABNORMAL LOW (ref 7–16)
BUN: 23 mg/dL — ABNORMAL HIGH (ref 7–18)
Bilirubin,Total: 1.1 mg/dL — ABNORMAL HIGH (ref 0.2–1.0)
CHLORIDE: 106 mmol/L (ref 98–107)
CREATININE: 0.97 mg/dL (ref 0.60–1.30)
Calcium, Total: 9 mg/dL (ref 8.5–10.1)
Co2: 31 mmol/L (ref 21–32)
EGFR (African American): >60
GLUCOSE: 137 mg/dL — AB (ref 65–99)
OSMOLALITY: 291 (ref 275–301)
Potassium: 3.4 mmol/L — ABNORMAL LOW (ref 3.5–5.1)
SGOT(AST): 29 U/L (ref 15–37)
Sodium: 143 mmol/L (ref 136–145)
Total Protein: 6.9 g/dL (ref 6.4–8.2)

## 2013-07-05 LAB — PRO B NATRIURETIC PEPTIDE: B-TYPE NATIURETIC PEPTID: 4656 pg/mL — AB (ref 0–125)

## 2013-07-05 LAB — TSH: Thyroid Stimulating Horm: 1.93 u[IU]/mL

## 2013-07-23 ENCOUNTER — Ambulatory Visit: Payer: Self-pay | Admitting: Family Medicine

## 2013-07-26 ENCOUNTER — Other Ambulatory Visit: Payer: Self-pay | Admitting: Family Medicine

## 2013-07-26 LAB — CBC WITH DIFFERENTIAL/PLATELET
BASOS PCT: 0.4 %
Basophil #: 0.1 10*3/uL (ref 0.0–0.1)
EOS ABS: 0.1 10*3/uL (ref 0.0–0.7)
Eosinophil %: 0.8 %
HCT: 48.1 % (ref 40.0–52.0)
HGB: 14.1 g/dL (ref 13.0–18.0)
LYMPHS ABS: 1.5 10*3/uL (ref 1.0–3.6)
LYMPHS PCT: 9.4 %
MCH: 27.8 pg (ref 26.0–34.0)
MCHC: 29.4 g/dL — ABNORMAL LOW (ref 32.0–36.0)
MCV: 95 fL (ref 80–100)
MONOS PCT: 6.1 %
Monocyte #: 1 x10 3/mm (ref 0.2–1.0)
NEUTROS ABS: 12.9 10*3/uL — AB (ref 1.4–6.5)
Neutrophil %: 83.3 %
Platelet: 143 10*3/uL — ABNORMAL LOW (ref 150–440)
RBC: 5.09 10*6/uL (ref 4.40–5.90)
RDW: 19.8 % — ABNORMAL HIGH (ref 11.5–14.5)
WBC: 15.5 10*3/uL — ABNORMAL HIGH (ref 3.8–10.6)

## 2013-07-26 LAB — COMPREHENSIVE METABOLIC PANEL
ALBUMIN: 2.7 g/dL — AB (ref 3.4–5.0)
ALK PHOS: 147 U/L — AB
ALT: 54 U/L (ref 12–78)
AST: 82 U/L — AB (ref 15–37)
BUN: 47 mg/dL — AB (ref 7–18)
Bilirubin,Total: 0.8 mg/dL (ref 0.2–1.0)
CREATININE: 1.56 mg/dL — AB (ref 0.60–1.30)
Calcium, Total: 8.5 mg/dL (ref 8.5–10.1)
Chloride: >128 mmol/L — ABNORMAL HIGH (ref 98–107)
Co2: 26 mmol/L (ref 21–32)
EGFR (African American): 51 — ABNORMAL LOW
EGFR (Non-African Amer.): 44 — ABNORMAL LOW
Glucose: 98 mg/dL (ref 65–99)
Potassium: 3.8 mmol/L (ref 3.5–5.1)
Total Protein: 7 g/dL (ref 6.4–8.2)

## 2013-08-25 DEATH — deceased

## 2014-05-17 NOTE — Op Note (Signed)
PATIENT NAME:  Martin Key, Martin Key MR#:  [id number] DATE OF BIRTH:  29-Apr-1940  DATE OF PROCEDURE:  10/03/2012  PREOPERATIVE DIAGNOSIS: Right inguinal hernia, umbilical hernia.   POSTOPERATIVE DIAGNOSIS:  Right inguinal hernia, umbilical hernia.  PROCEDURE: Repair right inguinal hernia. Repair umbilical hernia.   SURGEON: Renda Rolls, M.D.   ANESTHESIA: General.   INDICATIONS: This 74 year old male has had long-standing bulging at the umbilicus but more recent development of bulging in the right groin. He has been having chronic pain at the hernia site and recommended repair for definitive treatment. It appeared prudent to repair the umbilical hernia at the same session.   The patient was placed on the operating table in the supine position under general endotracheal anesthesia. The abdomen was clipped and prepared with ChloraPrep and draped in a sterile manner.   A right lower quadrant transversely oriented incision was made slightly higher than the level of the pubis at the fold, associated with lower abdominal pannus. The incision was carried down through subcutaneous tissues. One traversing vein was divided between 3-0 chromic suture ligatures. Scarpa fascia was incised. The external oblique aponeurosis was incised along the course of its fibers to open the external ring and expose the inguinal cord structures. The cord structures were mobilized from the floor of the inguinal canal. The floor of the inguinal canal appears to be intact. Cremaster fibers were spread to expose an indirect hernia sac which did contain a trace of ascites. The sac was some 4 inches in length and was dissected free from surrounding structures up well into the internal ring. A high ligation of the sac was done with a 0 Surgilon suture ligature. The sac was excised. The stump was allowed to retract. Next, the floor of the inguinal canal was reinforced with a row of 0 Surgilon sutures, suturing the conjoined tendon to the  shelving edge of the inguinal ligament beginning at the pubic tubercle and carrying this up to satisfactory narrowing of the internal ring. Next, an onlay Atrium mesh was cut to create an oval shape of some 2.5 x 3.5 cm with a notch cut out to straddle the cord structures, placed along the floor of the inguinal canal and sutured to the repair with 0 Surgilon. Also, this was sutured to the fascia on both sides of the internal ring and also sutured medially to the fascia. Hemostasis was intact. The repair looked good. The cord structures were replaced along the floor of the inguinal canal. Cut edges of the external oblique aponeurosis were closed with running 4-0 Vicryl. The deep fascia superior and lateral to the repair site was infiltrated with 0.5% Sensorcaine with epinephrine. Subcutaneous tissues were infiltrated as well, using a total of 20 mL Next, Scarpa fascia was closed with interrupted 4-0 Vicryl. The skin was closed with running 4-0 Monocryl subcuticular suture.   Next, attention was turned to the umbilical hernia repair where a supraumbilical transversely oriented 2.5 cm incision was made, carried down through subcutaneous tissues to encounter umbilical hernia sac which was dissected free from surrounding structures. It was divided just beneath the contact with the skin of the posterior aspect of the umbilicus, and the sac was excised. A small portion of mesh was cut out, approximately 8 x 12 mm, placed into the properitoneal plane and was sutured to the overlying fascia with 0 Surgilon. Next, the repair was carried out with a transversely oriented suture line of interrupted 0 Surgilon figure-of-eight sutures incorporating mesh into the repair. The repair looked good.  The skin of the umbilicus was tacked to the deep fascia with 3-0 chromic sutures. The skin was closed with running 4-0 Monocryl subcuticular suture.   Both wounds were then treated with Dermabond. The testicle remained in the scrotum.  The patient was in satisfactory condition and was prepared for transfer to the recovery room for postoperative care.     ____________________________ J. Renda RollsWilton Smith, MD jws:dmm D: 10/03/2012 09:25:38 ET T: 10/03/2012 09:51:50 ET JOB#: [id number]  cc: Martin HareJ. Martin Smith, MD, <Dictator> Martin HareWILTON J SMITH MD ELECTRONICALLY SIGNED 10/03/2012 18:00

## 2014-05-18 NOTE — Consult Note (Signed)
PATIENT NAME:  [name], Jevan MR#:  [id number] DATE OF BIRTH:  [birth year]  DATE OF CONSULTATION:  06/29/2013  REFERRING PHYSICIAN:  Dr. Amado CoeGouru CONSULTING PHYSICIAN:  Lamar BlinksBruce J. Carry Weesner, MD  REASON FOR CONSULTATION: Acute on chronic congestive heart failure, atrial flutter, diabetes, coronary artery disease, hypertension and extremity edema.   CHIEF COMPLAINT:  Short of breath.  HISTORY OF PRESENT ILLNESS:  This is a 74 year old male with known chronic LV systolic dysfunction with some concerns of sleep apnea who has had chronic atrial flutter with reasonable heart rate control with new onset of acute on chronic congestive heart failure with shortness of breath, multifactorial in nature, including diet, hypoxia and atrial flutter.  The atrial flutter is very well controlled today without evidence of significant further issue. The patient does have peripheral edema and other peripheral bleeding and bruising of which have been minor at this stage. The patient does have diabetes, coronary artery disease and hypertension, has had no evidence of myocardial infarction with a normal troponin. The patient does have some improvements of shortness of breath with oxygenation, as well as diuretics.   REVIEW OF SYSTEMS: The remainder review of systems negative for vision change, ringing in the ears, hearing loss, cough, congestion, heartburn, nausea, vomiting, diarrhea, bloody stools, stomach pain, extremity pain, leg weakness, cramping of the buttocks, known blood clots, headaches, blackouts, dizzy spells, nosebleeds, congestion, trouble swallowing, frequent urination,  muscle weakness, numbness, anxiety, depression, skin lesions or skin rashes are positive with below.   PAST MEDICAL HISTORY: 1.  Atrial flutter.  2.  Diabetes.  3.  Coronary artery disease. 4.  Hypertension.  5.  Heart failure.   FAMILY HISTORY: Some family members with early onset of cardiovascular disease.   SOCIAL HISTORY: Currently denies  tobacco, alcohol use.   ALLERGIES: As listed.   MEDICATIONS: As listed.   PHYSICAL EXAMINATION: VITAL SIGNS: Blood pressure 122/68 bilaterally. Heart rate is 92 upright, reclining and irregular and disheveled, elderly male with distress including shortness of breath.  HEAD, EYES, EARS, NOSE AND THROAT: No icterus, thyromegaly, ulcers, hemorrhage, xanthelasma.  CARDIOVASCULAR: Irregularly irregular. Normal S1 and S2; 2/6 to 3/6 apical murmur consistent with mitral regurgitation. PMI is inferiorly displaced. Carotid upstroke normal without bruit. Jugular venous pressure normal.  LUNGS: Have bibasilar crackles with some expiratory wheezes.  ABDOMEN: Soft, nontender. Cannot assess hepatosplenomegaly or masses due to increased abdominal girth.  EXTREMITIES: Show 2+ radial, trace femoral and dorsal pedal pulses with 1 to 2+ lower extremity edema. There is cyanosis and other ulcers.  NEUROLOGIC: He is oriented to time, place and person with normal mood and affect.   ASSESSMENT: A 74 year old male with acute on chronic systolic dysfunction, congestive heart failure, atrial flutter, diabetes, coronary artery disease, hypertension, needing further treatment options.   RECOMMENDATIONS: 1.  Continue heart rate control with calcium channel blocker, beta blocker combination as able.  2.  Avoid amiodarone due to hypoxia and significant side effects in the past.  3.  Consider anticoagulation for further risk reduction in stroke with atrial flutter, atrial fibrillation and was scheduled for the possibility of ablation therapy thereafter.  4.  Lasix for lower extremity edema and pulmonary edema. 5.  Diabetes with appropriate insulin injection.  6.  No further cardiac intervention at this time due to relative stability of symptoms today.   ____________________________ Lamar BlinksBruce J. Benjerman Molinelli, MD bjk:ce D: 06/29/2013 15:02:34 ET T: 06/29/2013 17:38:38 ET JOB#: [id number]  cc: Lamar BlinksBruce J. Ashante Yellin, MD,  <Dictator> Lamar BlinksBRUCE J Elias Bordner MD ELECTRONICALLY SIGNED  07/03/2013 14:46 

## 2014-05-18 NOTE — H&P (Signed)
PATIENT NAME:  Martin Key, Martin Key MR#:  [id number] DATE OF BIRTH:  1940/11/03  DATE OF ADMISSION:  05/24/2013  REFERRING PHYSICIAN: Dr. Carollee MassedKaminski.   PRIMARY CARE PHYSICIAN[facility]   PRIMARY CARDIOLOGIST: Dr. Gwen PoundsKowalski.   HISTORY OF PRESENT ILLNESS: The patient is a 74 year old Caucasian male who was just discharged yesterday from the hospital after a hospitalization on the 24th for acute respiratory failure, atrial fibrillation with RVR, encephalopathy, and CHF. Of note, patient's EF is less than 20%, from an echocardiogram as of April 2th. During the last hospitalization, he was diuresed with a diuretic started on prednisone taper, treated with increased dosages of beta blockers for his atrial fibrillation with RVR. He was discharged yesterday and he came back today after experiencing off and on palpitations, dizziness, which was severe this morning, lightheadedness. He came into the hospital where he was noted to be in atrial flutter, 2:1 block with a rate of 121-122 range. He has received 2 doses of IV diltiazem, 2 doses of IV metoprolol. His heart rate is still in the 120s.  The hospitalist service was contacted for further evaluation and management. Of note, on arrival, he did provide some history but then he was very anxious and he wanted to leave against medical advice and I did convince him to stay after explaining that he has a significant risk of having recurrent CHF or MI or even death if he leaves and he decided to stay. At this time, we will admit him to the hospitalist service under telemetry.   PAST MEDICAL HISTORY:  1. Chronic systolic CHF with EF of less than 20%.  2. History of atrial fibrillation.  3. Hypertension.  4. Diabetes.  5. Hyperlipidemia.  6. History of CAD.  7. History of questionable myasthenia gravis.  8. Recent hospitalization as dictated above.    SOCIAL HISTORY: He smokes tobacco. Denies alcohol or drug use.   FAMILY HISTORY: Per  chart, CAD in multiple family members.   ALLERGIES: No known drug allergies.   OUTPATIENT MEDICATIONS: Of note, it appears that he was discharged on metoprolol 200 mg daily, a prednisone taper, Cardizem XT 240 mg once a day, pravastatin 40 mg daily, omeprazole 20 mg 2 times a day, mirtazapine 15 mg daily, aspirin 81 mg daily, torsemide 10 mg daily, and potassium 10 mEq once a day.   REVIEW OF SYSTEMS: CONSTITUTIONAL: Positive for fatigue, weakness.  EYES: No blurry vision or double vision.  EARS, NOSE, AND THROAT: No tinnitus or hearing loss.  RESPIRATORY: His cough and shortness of breath have improved.  CARDIOVASCULAR: No chest pain but has had palpitations and some swelling in the legs.  GASTROINTESTINAL: No nausea, vomiting, diarrhea, abdominal pain.  GENITOURINARY: Denies dysuria, hematuria. HEMATOLOGY AND LYMPHATIC:  No anemia or easy bruising.  SKIN: No rashes.  MUSCULOSKELETAL: Denies arthritis or gout.  NEUROLOGIC: No focal weakness or numbness or questionable history of myasthenia gravis.  PSYCHIATRIC: Denies anxiety or insomnia.   PHYSICAL EXAMINATION:  VITAL SIGNS: Temperature on arrival 97.8, initial pulse rate 123, pulse rate while I was in the room is still 123, respiratory rate on arrival 22. Initial blood pressure 121/82, with saturation 92% on room air.  GENERAL: The patient is older than age Caucasian male lying in bed, a little anxious, but no obvious distress.  HEENT: Normocephalic, atraumatic. Pupils are equal and reactive. Anicteric sclerae. Moist mucous membranes.  NECK: Supple. No thyroid tenderness. No cervical lymphadenopathy. No JVD.  CARDIOVASCULAR: S1, S2, tachycardic. No  significant murmurs appreciated.  LUNGS: Clear to auscultation without wheezing, rhonchi, or rales.  ABDOMEN: Soft, nontender, nondistended.  EXTREMITIES: 2+ pitting edema to mid shins.  PSYCHIATRIC: Awake, alert, oriented x 3.   NEUROLOGIC: Cranial nerves II-XII grossly intact. Strength  is 5/5 in all extremities. Sensation is intact to light touch.  SKIN: Multiple tattoos.   LABORATORY AND IMAGING: Urinalysis does not suggest an infection. White count of 14.7, hemoglobin 13.7. Troponin negative on arrival at 0.05. BNP is 16,783, which is lower than the previous one from 19,000 on April 24th.  BUN 28, creatinine 0.84, sodium 140, potassium 4.2. X-ray chest, PA and lateral, showing mild congestive heart failure with tiny bilateral effusions.  EKG: A 2:1 atrial flutter with rate of 121 and another one, 123.   ASSESSMENT AND PLAN: We have a 74 year old male who was just discharged here from the hospital yesterday for atrial fibrillation with rapid ventricular response, acute respiratory failure, encephalopathy, congestive heart failure comes in for palpitations noted to have atrial flutter 2:1. It appears that previously he was in atrial fibrillation, now the rate is uncontrolled and discussed with [facility] who recommended continuation of his beta blocker and calcium channel blocker in addition of amiodarone p.o. at this time. At this point, we are going to defer anticoagulation for this patient, but he does have CHADS score requiring anticoagulation with congestive heart failure, hypertension, and history of diabetes, although he is not on any diabetes medications.  We will admit him to telemetry, cycle the troponins. In regard to his congestive heart failure, he appears to be in a euvolemic state with chronic systolic congestive heart failure. Would continue his prednisone taper for his recent bronchitis.  We will start him on heparin for deep vein thrombosis prophylaxis.   CODE STATUS:  The patient is full code.    ____________________________ Krystal Eaton, MD sa:dd D: 05/24/2013 17:22:06 ET T: 05/24/2013 18:54:55 ET JOB#: [id number]  cc: Krystal Eaton, MD, <Dictator> Lamar Blinks, MD Marya Amsler. Dareen Piano, MD  Krystal Eaton MD ELECTRONICALLY SIGNED 06/19/2013  15:53

## 2014-05-18 NOTE — Consult Note (Signed)
PATIENT NAME:  Martin Key, Martin Key MR#:  [id number] DATE OF BIRTH:  13-Aug-1940  DATE OF CONSULTATION:  06/30/2013  CONSULTING PHYSICIAN:  Pauletta BrownsYuriy Delanda Bulluck, MD  REASON FOR CONSULTATION: Altered mental status and frequent falls.   HISTORY OF PRESENT ILLNESS: This is a 74 year old gentleman with a past medical history of diabetes, chronic atrial fibrillation, hypertension, coronary artery disease, hyperlipidemia, congestive heart failure status post discharge from [facility] with findings of congestive heart failure, ejection fraction of about 20%. He was sent to rehab facility. After 30 days, insurance would not cover the rehab facility and he was discharged home. After being discharged home, EMS was called by patient's girlfriend who stated that patient has been having increased number of falls. On admission the patient was found to have left upper extremity, left lower extremity lacerations. Our chest x-ray revealed pulmonary vascular congestion status post IV Lasix.  PAST MEDICAL HISTORY: Hypertension, diabetes, chronic atrial fibrillation not on Coumadin, coronary artery disease, systolic  CHF with an EF of 20%.   PAST SURGICAL HISTORY: Polypectomy, umbilical hernia repair, and inguinal hernia repair.   ALLERGIES: No known drug allergies.   PSYCHOSOCIAL HISTORY: Just discharged from a rehab facility and residing with a girlfriend. No history of smoke, illicit drug, or alcohol use.   REVIEW OF SYSTEMS: Unable to obtain at this time.   LABORATORY DATA: Workup has been reviewed.   PHYSICAL EXAMINATION:  VITAL SIGNS: Include a temperature of 99.4, pulse 105, respirations 18, blood pressure 94/61, pulse oximetry 92%.  GENERAL: On physical examination, the patient is able tell me his name and that he is in the hospital, could not tell me the date and time. Speech appears to be fluent. HEENT: Extraocular movements are intact. The patient responds to visual threats bilaterally. Tongue is  midline. Uvula elevates symmetrically.  NEUROLOGIC: Motor strength appears to be 4/5 bilateral upper and lower extremities. Reflexes are diminished. Sensation intact, as the patient withdraws from painful stimuli. Coordination is hard to assess and gait could not be assessed.   IMPRESSION: A 74 year old gentleman with coronary artery disease, hyperlipidemia, congestive heart failure with ejection fraction of 20%, recent discharge to rehab facility after which he was sent home with his girlfriend, presents with multiple falls, confusion. I think the patient's symptoms are a combination of metabolic encephalopathy, delirium, as well as generalized deconditioning.   PLAN: I agree with continuation of thiamine, folate, multivitamin. Agree with limiting Haldol and benzodiazepines. If needed for agitation, would prefer using newer agents such as Seroquel for agitation all of which also prolong QT interval. Hydration. Case management and social work for discharge planning as I do not feel this patient is safe to be discharged home. I do not think this patient needs any further imaging from a neurological standpoint. The case was discussed with Dr. Worthy KeelerBurt Klein.   Thank you. It was a pleasure seeing this patient.    ____________________________ Pauletta BrownsYuriy Emad Brechtel, MD yz:lt D: 06/30/2013 19:05:37 ET T: 06/30/2013 22:50:09 ET JOB#: [id number]  cc: Pauletta BrownsYuriy Reeves Musick, MD, <Dictator> Pauletta BrownsYURIY Conchita Truxillo MD ELECTRONICALLY SIGNED 07/12/2013 17:21

## 2014-05-18 NOTE — Consult Note (Signed)
PATIENT NAME:  Martin Key, [name] MR#:  [id number] DATE OF BIRTH:  04/24/1940  DATE OF CONSULTATION:  05/21/2013  CONSULTING PHYSICIAN:  Jordayn Mink D. Juliann Paresallwood, MD  PRIMARY CARE PHYSICIAN:  Dr. Dareen PianoAnderson.  CARDIOLOGIST:  Dr. Gwen PoundsKowalski.   INDICATION FOR CONSULTATION:  Afib, shortness of breath, possible heart failure.   HISTORY OF PRESENT ILLNESS: The patient is a 74 year old white male with history of hyperlipidemia, diabetes, hypertension, Afib, coronary artery disease with systolic congestive heart failure. He had a catheterization done in 2012. He has had some shortness of breath, described 2 to 4 days of shortness of breath, dyspnea on exertion, cough and congestion, lower extremity edema. He states it has been nonproductive. Denies any fever, chills or sweats or chest pain. He complained of generalized weakness. Over about the same time, he saw a primary physician who gave him some antibiotics. The patient states he did not get any better, so he came to the hospital for admission. On evaluation, he was noted to be hypoxic and was placed on supplemental O2.    REVIEW OF SYSTEMS:  No blackout spells or syncope. No nausea or vomiting. No fever. No chills. No sweats. No weight loss. No weight gain. No hemoptysis or hematemesis. Denies bright red blood per rectum. Denies vision change, hearing change. He has had cough, but no significant sputum production.   PAST MEDICAL HISTORY: Hyperlipidemia, diabetes, hypertension, atrial fibrillation, coronary artery disease, congestive heart failure, cardiomyopathy, possible myasthenia gravis; the diagnosis is not clear.   SOCIAL HISTORY:  Tobacco use. Denies alcohol consumption. Retired.   FAMILY HISTORY: Coronary artery disease.   ALLERGIES: None.   HOME MEDICATIONS: Cardizem 240 a day, mirtazapine 50 mg at bedtime, pravastatin 40 daily, metoprolol 100 a day, Lasix 40 daily, Prilosec 20 twice a day, multivitamin daily, cefuroxime 250 daily.   PHYSICAL  EXAMINATION: VITAL SIGNS: Blood pressure was 110/65, pulse of around 100 and irregular, respiratory rate of 18. Afebrile.  HEENT: Normocephalic, atraumatic. Pupils equal and reactive to light.  NECK: Appeared to be supple. No significant JVD, bruits or adenopathy.  LUNGS: Bilateral rhonchi. Bilateral rales, some crackles in the base. There was some mild scattered wheezing. Good respiratory effort.  HEART: Distant, irregular with a systolic ejection murmur at the apex.  ABDOMEN: Benign. Positive bowel sounds. No rebound, guarding or tenderness.  EXTREMITIES: 2+ edema.  NEUROLOGIC: Intact.  SKIN: Normal.   LABORATORY DATA:  EKG: Atrial fibrillation, rate of 100, nonspecific ST-T wave changes. Chest x-ray with pulmonary vascular congestion, cardiomegaly.  Other laboratories:  Sodium 137, potassium 3.4, chloride of 100, BUN of 19, creatinine of 1.2, glucose 121. BNP 19,790. Calcium 8.4. Troponin 0.02. White count of 15.9, hemoglobin 12.5, platelet count 156.   ASSESSMENT: Acute on chronic respiratory failure, acute on chronic congestive heart failure with systolic dysfunction, bronchitis, atrial fibrillation with rapid ventricular response, edema, mild confusion, tobacco abuse.   PLAN:  1.  Agree with admit. Rule out for myocardial infarction. Follow up cardiac enzymes. Follow up EKG.  Follow up troponins. Continue current therapy, anticoagulation, DVT prophylaxis.  2.  For bronchitis continue inhalers. Continue possibly steroids.  3.  For heart failure, continue diuretics, continue supplemental oxygen.  4. For atrial fibrillation rate control, continue He may be a poor long-term anticoagulation risk because I am not sure about his fall risk.  5.  Deep vein thrombosis prophylaxis.  6.  For systolic dysfunction, continue therapy with possibly ACE inhibitor, if his blood pressure will tolerate it. Continue diuretic therapy as well.  Echocardiogram may be helpful. We will treat the patient medically for  now with supplemental oxygen and inhalers. Consider pulmonary input, but we will treat his heart failure aggressively as best we can medically. Do not recommend any invasive studies at this point.   ____________________________ Bobbie Stack. Juliann Pares, MD ddc:dmm D: 05/21/2013 17:48:19 ET T: 05/21/2013 19:06:44 ET JOB#: [id number]  cc: Irena Gaydos D. Juliann Pares, MD, <Dictator> Alwyn Pea MD ELECTRONICALLY SIGNED 06/20/2013 16:26

## 2014-05-18 NOTE — Discharge Summary (Signed)
PATIENT NAME:  Martin JeffersonKAIN, Eren MR#:  [id number] DATE OF BIRTH:  [birth year]  DATE OF ADMISSION:  06/29/2013 DATE OF PLANNED DISCHARGE:  07/03/2013   DISCHARGE DIAGNOSES:  1. Encephalopathy, metabolic versus concussion.  2. Congestive heart failure, some evidence on chest x-ray, though very mild interstitial edema per report.  3. Recent rehabilitation admission with confusion and encephalopathy, with discharge home the day prior to admission here.   HOSPITAL COURSE: Workup of his falls and further injuries was negative, with multiple CTs, including a head CT, and sinusitis was seen there. CT of his neck showed no fracture or obvious other changes. Pelvis was negative. Lumbar spine was negative for acute injuries. He was kept on diuretics, usually on torsemide. He remained confused, difficult to manage. Fall risk was very high. It does seem he will need continual 24-hour care. Will see how he does at rehab, but likely he will at least need assisted living long-term or he will be at acute risk of falls and further injury. He was told this. This will certainly need to stay in his medical record for him to not be discharged unless he is much better prior to discharge from the rehab facility again. He did have some mild hypokalemia. His potassium dose was increased while here. He is somewhat hesitant to return back to the rehab facility, but again, he understands there is no alternative as he has no reason to stay here longer in the hospital and he cannot go home for multiple reasons, and his girlfriend and is not able to care for him, which she has stated under no uncertain terms.   TIME SPENT: Please note that it took approximately 35 minutes to do all discharge tasks today.    ____________________________ Marya AmslerMarshall W. Dareen PianoAnderson, MD mwa:lb D: 07/03/2013 08:11:56 ET T: 07/03/2013 08:33:57 ET JOB#: [id number]  cc: Marya AmslerMarshall W. Dareen PianoAnderson, MD, <Dictator> Lauro RegulusMARSHALL W Rebeccah Ivins MD ELECTRONICALLY SIGNED 07/03/2013 12:43

## 2014-05-18 NOTE — Discharge Summary (Signed)
PATIENT NAME:  Martin Key, Martin Key MR#:  [id number] DATE OF BIRTH:  01/08/1941  DATE OF ADMISSION:  05/18/2013 DATE OF DISCHARGE:  05/23/2013  DISCHARGE DIAGNOSES: 1. Acute on chronic respiratory failure.  2. Chronic obstructive pulmonary disease exacerbation.  3. Congestive heart failure from left ventricular dysfunction, severe.  4. Metabolic encephalopathy.  5. Rapid atrial fibrillation.   DISCHARGE MEDICATIONS: [facility]. Basically, will be on his home medications with the change from Lasix to torsemide 10 mg daily, addition of  potassium chloride 10 mEq, prednisone taper 60 mg decreasing x 20 mg every five days until off and increasing his metoprolol from 100 to 200 mg a day for rapid atrial fibrillation.   HISTORY AND PHYSICAL: Please see detailed history and physical done on admission.   HOSPITAL COURSE: The patient was admitted with rapid atrial fibrillation, congestive heart failure, borderline blood pressures. He initially could not be diuresed very well given that by Dr. Graciela HusbandsKlein, covering on weekends. I increased his IV Lasix. He was diuresed well. Started IV steroids which helped his wheezing and coughing. He was able to be weaned off of oxygen with these two measures. He has severe LV dysfunction based on echocardiogram, was seen by cardiology who increased his metoprolol. He remained in relatively fast atrial fibrillation in the 110 to 120 range. However, amiodarone was not added as Dr. Gwen PoundsKowalski in the past had not wanted to anticoagulate him and he has some confusion now, so starting that is probably not his best interest until this clears back to his baseline. We will have him see his cardiologist soon for consideration of amiodarone and Eliquis or other anticoagulants that can be safely given together so as we do not cardiovert him on amiodarone alone and cause embolic phenomenon. Discussed this with his wife today, who agrees for him to go home as he was quite anxious to go  home. Blood pressure was 111 and 104 on the last two checks systolic. He was ambulating reasonably well, although again, he was confused. We will hold off on his digoxin as it did not seem to help his rate and given his age, etc. there is some danger in that as well. His creatinine was 0.98 today with a  greater than 60 GFR. I will see him soon in the office as well as Dr. Arnoldo HookerBruce Kowalski.   TIME SPENT: Approximately 35 minutes to do all discharge tasks.  ____________________________ Marya AmslerMarshall W. Dareen PianoAnderson, MD mwa:sg D: 05/23/2013 08:01:28 ET T: 05/23/2013 08:10:35 ET JOB#: [id number]  cc: Marya AmslerMarshall W. Dareen PianoAnderson, MD, <Dictator> Lauro RegulusMARSHALL W Desarai Barrack MD ELECTRONICALLY SIGNED 05/24/2013 8:05

## 2014-05-18 NOTE — Consult Note (Signed)
PATIENT NAME:  Martin Key, Martin Key MR#:  [id number] DATE OF BIRTH:  Jan 07, 1941  DATE OF CONSULTATION:  05/19/2013  CONSULTING PHYSICIAN:  Pauletta BrownsYuriy Geni Skorupski, MD  REASON FOR CONSULTATION: History of myasthenia gravis.   HISTORY OF PRESENTING ILLNESS: A 74 year old male with history of hyperlipidemia diabetes, hypertension, atrial fibrillation, coronary artery disease, history of cardiac catheter, presenting with 3-4-day history of shortness of breath and nonproductive cough. Denies any fever, chills, chest palpitations, presenting also with generalized weakness, found to have acute on chronic congestive heart failure, with exacerbation. There was a questionable history of myasthenia gravis in the patient's history even though the patient denies it.  REVIEW OF SYSTEMS: Denies fever, positive fatigue, denies blurred vision, denies double vision. Denies tinnitus. Positive for cough denies chest pain. Denies nausea, denies vomiting. Denies dysuria denies hematuria denies easily bruising. Denies pain in the neck.  PAST MEDICAL HISTORY: Hyperlipidemia, diabetes, hypertension, atrial fibrillation, coronary artery disease, systolic congestive heart failure, a history of cardiac catheterization.   SOCIAL HISTORY: Positive for tobacco use, denies alcohol or drug use.   FAMILY HISTORY: Positive call for coronary artery disease.   ALLERGIES: No known drug allergies.   LABORATORY DATA: Workup has been reviewed.   PHYSICAL EXAMINATION: VITALS SIGNS: Temperature 97.4, pulse 106, respirations 18, blood pressure 99/68, pulse oximetry 92%. The patient on 6 liters O2. GENERAL: The patient is alert, awake to hospital and the month, not to date. He can tell me who is the [place].  HEENT: Extraocular movements intact. Visual fields intact. No blurry vision. No double vision. Responds to visual threats bilaterally. Facial sensation intact. Facial motor is intact. Tongue in midline.  NEUROLOGIC: Motor  strength: Generalized weakness 4/5 bilateral upper and lower extremities. Reflexes intact. Coordination intact. Sensation intact.   IMPRESSION: A 74 year old gentleman with history of atrial fibrillation, hypertension, coronary artery disease, presenting with shortness of breath, found to have acute on chronic systolic congestion failure with exacerbation. There was a questionable history of myasthenia in the history. On current examination, I do not think the patient is in myasthenic crisis, though has myasthenia exacerbation. Generalized weakness is nonspecific for this current diagnosis. Unsure how the prior diagnosis was made. Myasthenia usually affects extraocular muscles and bulbuar muscles initially and spreads. It does not usually affect lower extremities initially.   PLAN: At this point, would not do any treatment for myasthenia. The patient's strength is improving and mental status is improving as well. Convinced this is not myasthenia exacerbation. Would not start any Mestinon at this point. Follow up with neurology as outpatient as needed. No further neurological input in terms of no further imaging, no need for EEG either at this point. Thank you. It was a pleasure seeing this consultation.    ____________________________ Pauletta BrownsYuriy Pippa Hanif, MD yz:lt D: 05/19/2013 16:49:57 ET T: 05/19/2013 21:29:10 ET JOB#: [id number]  cc: Pauletta BrownsYuriy Eliberto Sole, MD, <Dictator> Pauletta BrownsYURIY Opaline Reyburn MD ELECTRONICALLY SIGNED 05/23/2013 11:35

## 2014-05-18 NOTE — Consult Note (Signed)
PATIENT NAME:  Martin Key, Martin Key MR#:  [id number] DATE OF BIRTH:  1940/07/24  DATE OF ADMISSION: 05/27/2013  DATE OF CONSULTATION:  05/27/2013  CONSULTING SERVICE: Gastroenterology  CONSULTING PHYSICIAN:  Martin Minium, MD  REASON FOR CONSULTATION: Heme positive stools, with dark stools.   HISTORY OF PRESENT ILLNESS: This patient is a 74 year old gentleman who has a history of a recent admission to the hospital, with discharge a few days ago, who now comes back with dark stools. He had a Hemoccult done in the ED that was positive. The patient was also found to have abnormal liver enzymes. The patient denies any abdominal pain, except prior to having a bowel movement this morning. The patient was recently in the hospital for acute on chronic respiratory failure with COPD, congestive heart failure, and rapid atrial fibrillation. The patient now states he does not like the idea of being in the hospital, but reports that he was feeling very weak and tired when he went home. The patient has a history of AFib and is on Eliquis. The admitting hemoglobin was 12.9, when the patient had a hemoglobin of 13.7 on the first of this month. The patient denies any nausea or vomiting. He also was found to have an elevated troponin of 0.08, which has gone up to 0.12 today.   PAST MEDICAL HISTORY: Hyperlipidemia, diabetes, hypertension, AFib, coronary artery disease, systolic congestive heart failure.   SOCIAL HISTORY: Positive for tobacco, no alcohol or drugs.   FAMILY HISTORY: Noncontributory.   ALLERGIES: No known drug allergies.   HOME MEDICATIONS: Amiodarone, Cardizem, mirtazapine, pravastatin, omeprazole, torsemide.  REVIEW OF SYSTEMS:  A 10-point review of systems is negative, except what was stated above.   PHYSICAL EXAMINATION: VITAL SIGNS: Temperature 98.2, pulse 79, respirations 18, blood pressure 120/83, pulse oximetry 92%.  GENERAL: The patient is sitting in bed, trying to stand up from the bed, saying  he wants to go home and does not want to be in the hospital.  HEENT: Normocephalic, atraumatic. Extraocular motor intact. Pupils equally round, reactive to light and accommodation, without JVD, without lymphadenopathy.  LUNGS: Clear to auscultation bilaterally.  HEART: Irregular rate and rhythm without murmurs, rubs or gallops.  ABDOMEN: Soft, nontender, nondistended, without hepatosplenomegaly.  EXTREMITIES: Without cyanosis, clubbing, or edema.   NEUROLOGIC EXAM: Grossly intact.  PSYCHIATRIC: The patient is able to hold a conversation, although his mental status is somewhat questionable, since he seems to irrationally want to go home, despite knowing that he is not up to par or his baseline, from a medical standpoint.  PERTINENT LABS: As stated above.   Abdominal ultrasound showed sludge and gallstones in the gallbladder, without evidence of cholecystitis.  LFTs showed bilirubin normal at 0.8, with alkaline phosphatase elevated at 122, AST of 99, ALT of 165.   ASSESSMENT AND PLAN: This patient is a 74 year old gentleman who has a slight drop in his hemoglobin, who now is admitted with elevated troponin and dark stools, with heme positive stools. The patient is to be evaluated by Cardiology. Would hold off on any endoscopic intervention at this time because of his cardiac status. The patient did have a colonoscopy approximately 5 years ago that showed numerous polyps, and the patient was supposed to have a repeat colonoscopy but never had that done. I would recommend the patient have full endoscopic evaluation, including upper endoscopy and colonoscopy, if cleared from a cardiac point of view.   Thank you very much for involving me in the care of this patient. If you  have any questions, please do not hesitate to call.    ____________________________ Martin Miniumarren Kippy Gohman, MD dw:mr D: 05/27/2013 12:20:37 ET T: 05/27/2013 16:34:23 ET JOB#: [id number]  cc: Martin Miniumarren Jahseh Lucchese, MD, <Dictator> Martin MiniumARREN Latravia Southgate  MD ELECTRONICALLY SIGNED 05/28/2013 7:10

## 2014-05-18 NOTE — H&P (Signed)
PATIENT NAME:  Martin Key, Martin Key MR#:  [id number] DATE OF BIRTH:  Sep 25, 1940  DATE OF ADMISSION:  05/18/2013  REFERRING PHYSICIAN: Dr. Fanny BienQuale.  PRIMARY CARE PHYSICIAN[facility]  CARDIOLOGIST: Dr. Gwen PoundsKowalski.   CHIEF COMPLAINT: Shortness of breath.   HISTORY OF PRESENT ILLNESS: A 74 year old Caucasian gentleman with history of hyperlipidemia, diabetes, hypertension, atrial fibrillation, coronary artery disease as well as systolic congestive heart failure based off of cardiac catheterization performed in 2012, presenting with shortness of breath. He describes a 3-4-day duration of shortness of breath, mainly it is described as dyspnea on exertion with associated worsening lower extremity edema as well as cough. He describes cough as nonproductive. Denies fevers, chills, palpitations, chest pain. He, however, does have generalized weakness for the same days of duration and he saw his PCP for above symptoms given antibiotic coverage for bronchitis symptoms. However, this continue to worsen thus presented to the hospital for further workup and evaluation. He is noted to be hypoxemic on arrival to the ER requiring supplemental O2. Currently, he is without complaints.   REVIEW OF SYSTEMS:  CONSTITUTIONAL: Denies fevers. Positive for fatigue, weakness. EYES: Denies blurry, double vision, or eye pain.  HEENT: Denies tinnitus, ear pain, hearing loss.  RESPIRATORY: Positive for cough as well as shortness of breath as described above.  CARDIOVASCULAR: Denies chest pain, palpitations. Positive for edema.  GASTROINTESTINAL: Denies nausea, denies vomiting, diarrhea, abdominal pain.  GENITOURINARY: Denies dysuria or hematuria.  ENDOCRINE: Denies nocturia or thyroid problems.  HEMATOLOGIC AND LYMPHATIC: Denies easy bruising, bleeding. SKIN: Denies rash or lesions. MUSCULOSKELETAL: Denies pain in neck, back, shoulders, knees or hips. No arthritic symptoms.  NEUROLOGIC: Denies paralysis or  paresthesias.  PSYCHIATRIC: Denies anxiety or depressive symptoms.   Otherwise, full review of systems performed by me is negative.   PAST MEDICAL HISTORY: Hyperlipidemia, diabetes, hypertension, atrial fibrillation, coronary artery, systolic congestive heart failure with low EF based on cardiac catheterization performed in 2012 as well as a questionable history of myasthenia gravis.   SOCIAL HISTORY: Positive for daily tobacco usage. Denies any alcohol or drug usage.   FAMILY HISTORY: Positive for coronary artery disease in multiple family members.   ALLERGIES: No known drug allergies.   HOME MEDICATIONS: Cardizem 240 mg p.o. daily, mirtazapine 50 mg p.o. at bedtime, pravastatin 40 mg p.o. daily, metoprolol 100 mg p.o. daily, Lasix 40 mg p.o. daily, Prilosec 20 mg p.o. b.i.d., multivitamin 1 tablet p.o. daily, started on cefuroxime to 250 mg p.o. b.i.d. just yesterday.   PHYSICAL EXAMINATION:  VITAL SIGNS: Temperature 98, heart rate 111, respirations 20, blood pressure 113/70, saturating 92% on supplemental O2. Weight 81.6 kg, BMI of 28.2.  GENERAL: Well-nourished, well-developed, Caucasian gentleman, currently in no acute distress.  HEAD: Normocephalic, atraumatic.  EYES: Pupils equal, round, reactive to light. Extraocular muscles intact. No scleral icterus.  MOUTH: Moist mucous membranes.  Dentition intact. No abscess noted.  EARS, NOSE, AND THROAT: Clear without exudates. No external lesions.  NECK: Supple. No thyromegaly. No nodules. No JVD.  PULMONARY: Decreased breath sounds throughout all lung fields with scattered wheezing. No use of accessory muscles. Good respiratory effort.  CHEST:  Nontender on palpation.  CARDIOVASCULAR: S1, S2, irregular rate, irregular rhythm. No murmurs, rubs, or gallops. Two-plus edema to the knees bilaterally. Pedal pulses 2+ bilaterally.  GASTROINTESTINAL: Soft, nontender, nondistended. No masses. Positive bowel sounds. No hepatosplenomegaly.   MUSCULOSKELETAL: No swelling, clubbing, or edema. Range of motion is full in all extremities. He is able to hold arms  outstretched without difficulty for greater than a minute.  NEUROLOGIC: Cranial nerves II-XII intact. No gross neurologic deficits. Sensation intact. Reflexes intact.  SKIN: No ulcerations, lesions, rashes, or cyanosis. Skin warm and dry. Turgor intact. Multiple tattoos. PSYCHIATRIC: Mood and affect within normal limits. The patient awake, alert and oriented x 3. Insight and judgment intact.   LABORATORY DATA: EKG performed revealing atrial fibrillation with multiple PVCs, heart rate 112. Chest x-ray performed: Cardiomegaly with pulmonary vascular congestion.  REMAINDER OF LABORATORY DATA: Sodium 137, potassium 3.4, chloride 100, bicarbonate 29, BUN 19, creatinine 1.21, glucose 121, BNP 19,790,  calcium 8.4, troponin less than 0.02. WBC 15.9, hemoglobin 12.5, platelets are 156,000.   ASSESSMENT AND PLAN: A 75 year old Caucasian gentleman with history of atrial fibrillation and coronary artery disease, presenting with shortness of breath.  1. Acute on chronic systolic congestive heart failure exacerbation. Place on telemetry, trend cardiac enzymes x 3. We will consult cardiology, Dr. Gwen Pounds, who he followed with in the past. Follow ins and outs and daily weights. Renal function, continue diuresis with Lasix, aspirin, beta blockers, as well as statin therapy.  2. Possible bronchitis given cough and leukocytosis. Continue with azithromycin, supplemental oxygen as required to keep oxygen saturation greater than 92%. DuoNeb therapy q. 4 hours as well as incentive spirometry.  3. Atrial fibrillation rate controlled using Cardizem.  4. Venous thromboembolism prophylaxis with heparin subcutaneously.  CODE STATUS: Patient is a full code.   TIME SPENT: 45 minutes    ____________________________ Cletis Athens. Hower, MD dkh:lt D: 05/18/2013 23:53:40 ET T: 05/19/2013 00:41:46  ET JOB#: [id number]  cc: Cletis Athens. Hower, MD, <Dictator> DAVID Synetta Shadow MD ELECTRONICALLY SIGNED 05/19/2013 20:23

## 2014-05-18 NOTE — H&P (Signed)
PATIENT NAME:  Martin Key, Martin Key MR#:  [id number] DATE OF BIRTH:  [birth year]  DATE OF ADMISSION:  06/29/2013  PRIMARY CARE PHYSICIAN:  Dr. Einar Crow.   REFERRING PHYSICIAN:  Dr. Lenard Lance.  CHIEF COMPLAINT:  Frequent falls.   HISTORY OF PRESENT ILLNESS:  The patient is a 74 year old Caucasian male with a past medical history of diabetes mellitus, chronic atrial fibrillation, hypertension, coronary artery disease, hyperlipidemia, congestive heart failure with recent echocardiogram on April 25th has revealed a left ventricular ejection fraction of less than 20% with severely decreased global left ventricular systolic function, was discharged from the hospital to rehab center after he had polypectomy, inguinal hernia and umbilical hernia repair done at [facility].  The patient was sent over to the rehab center and after 30 days of his stay at the rehabilitation center the patient was discharged home as his insurance would not cover anymore rehab stay.  The patient was very weak at the time of discharge to home.  He was unable to ambulate and falling frequently after he went home as reported by the ER staff.  The patient's girlfriend who called EMS as the patient is falling frequently and sent him to the ER.  In the ER, the patient had a CAT scan of the head done and CT of the cervical spine which did not reveal any acute fractures.  Chest x-ray has revealed pulmonary congestion and the patient comes with left upper extremity and left lower extremity laceration which were repaired and covered with a clean bandage in the ER.  The patient's chest x-ray has revealed pulmonary vascular congestion.  One dose of Lasix IV was given regarding pulmonary vascular congestion and pitting edema of the bilateral lower extremities.  The patient is quite lethargic, but arousable.  I was unable to get a good history from the patient as he is very lethargic and just received Percocet and being groggy.  Family  members are not available at bedside.   PAST MEDICAL HISTORY:  Hyperlipidemia, diabetes mellitus, hypertension, chronic atrial fibrillation, not on Coumadin, coronary artery disease, systolic congestive heart failure with a recent ejection fraction of less than 20%, questionable history of myasthenia gravis.   PAST SURGICAL HISTORY:  Recent history of polypectomy, umbilical hernia repair and inguinal hernia repair.   ALLERGIES:  No known drug allergies.  PSYCHOSOCIAL HISTORY:  Just discharged from rehabilitation center and residing with girlfriend.  No history of smoking, alcohol or illicit drug use.   FAMILY HISTORY:  Coronary artery disease runs in his family as per the old records.   HOME MEDICATIONS:  Tramdol 50 mg 1 tablet by mouth once daily , potassium chloride 10 mEq by mouth once daily, oxycodone 5 mg by mouth as needed for pain, omeprazole 20 mg by mouth 2 times a day, mirtazapine 15 mg 1 tablet by mouth once daily, Cartia extended release 240 mg 1 capsule by mouth once daily, amiodarone 200 mg 1 tablet by mouth once daily, alprazolam 0.25 mg 1 tablet by mouth as needed for anxiety.   REVIEW OF SYSTEMS:  Unobtainable.   PHYSICAL EXAMINATION: VITAL SIGNS:  Temperature afebrile.  Pulse 107, respirations 16 to 18, blood pressure 195/62, pulse ox is 93%.  Temperature 97.7.  GENERAL APPEARANCE:  Not under acute distress.  Moderately built and nourished.  The patient is lethargic.  HEENT:  Normocephalic.  Pupils are equal, reacting to light and accommodation.  No scleral icterus.  No conjunctival injection.  No sinus tenderness.  No postnasal drip.  Moist mucous membranes.  NECK:  Supple.  No JVD.  No thyromegaly.  Range of motion is intact.  LUNGS:  Positive rales and rhonchi.  CARDIOVASCULAR:  Irregularly irregular.  Positive murmur.  GASTROINTESTINAL:  Soft.  Bowel sounds are positive in all four quadrants.  Nontender, nondistended.  No hepatosplenomegaly.  No masses felt.  Hernia  repair scars are healing well.  NEUROLOGIC:  Arousable, but lethargic.  Unable to get good history from the patient.  The patient is mumbling.  Motor and sensory is unobtainable as the patient is not following verbal commands.  EXTREMITIES:  Bilateral lower extremities with 2+ pitting edema.  Left upper extremity, laceration was in clean bandage and the left lower extremity avulsion was cleaned and covered with clean bandage.  MUSCULOSKELETAL:  No joint effusion, tenderness or erythema.  PSYCHIATRIC:  Mood and affect could not be elicited as the patient is with altered mental status.   LABORATORY DATA:  Glucose 127, BUN 21, creatinine 1.13, sodium 140, potassium 3.4, chloride 109, CO2 25.  GFR greater than 60.  Anion gap, serum osmolality and calcium are normal.  LFTs:  Total protein is 6.2, albumin 3.1, bili total is 1.1, AST and ALT are normal.  CK total 134, CPK-MB 4.3.  Troponin is normal.  WBC 10.8, hemoglobin 11.5, hematocrit 36.0, platelets are 126,000, MCV 90.  Urine culture has revealed mixed bacteria which could be a contaminated specimen.  Urinalysis yellow in color, clear in appearance.  Glucose, bili, ketones are negative.  Nitrites and leukocyte esterase are negative.  A 12-lead EKG has revealed atrial fibrillation at 110 beats per minute, low voltage criteria.  No acute ST-T wave changes.  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  Pelvic x-ray, no acute fractures.  Lumbar spine AP and lateral view, multilevel degenerative disk disease.  No acute abnormality seen in the lumbar spine.  Chest x-ray one view, mild pulmonary vascular congestion with possibly very mild interstitial edema.   ASSESSMENT AND PLAN:  A 74 year old Caucasian  male who was just discharged from the rehabilitation center, went to stay with a girlfriend, was sent over to the Emergency Room for frequent falls.  The patient was not orthostatic in the Emergency Room.  He has multiple lacerations which were cleaned and repaired and covered with a clean bandage in the Emergency Room. 1.  Acute exacerbation of systolic congestive heart failure :  Recent echocardiogram in April 25th has revealed ejection fraction less than 20%.  We will admit him to telemetry.  Cycle cardiac biomarkers.  We will provide Lasix intravenous sand cardiology consult is placed to [facility] cardiology group.  2.  Frequent falls, probably from pitting edema.  We will provide him Lasix.  We will check daily weights and a physical therapy consult is placed for gait evaluation.  3.  Chronic history of atrial fibrillation.  Continue his home medications.  This is rate-controlled.  Continue aspirin and we will continue his home medication amiodarone.  4.  Diabetes mellitus.  The patient is not on any antihyperglycemics.  We will put him on sliding scale insulin. 5.  Coronary artery disease.  Continue home medications.  We will provide beta blocker.  6.  Hypertension.  Continue his home medications and titrate as needed.  7.  We will provide gastrointestinal and deep vein thrombosis prophylaxis.    The patient being with altered mental status I was unable to discuss the diagnoses with him.  No family members are available at bedside.  The patient will be transferred to Dr. Einar CrowMarshall Anderson in a.m.   Total time spent on the admission is 50 minutes.     ____________________________ Ramonita LabAruna Olivine Hiers, MD ag:ea D: 06/29/2013 00:59:49 ET T: 06/29/2013 01:45:15 ET JOB#: [id number]  cc: Ramonita LabAruna Varnell Donate, MD, <Dictator> Marya AmslerMarshall W. Dareen PianoAnderson, MD  Ramonita LabARUNA Baleigh Rennaker MD ELECTRONICALLY SIGNED 07/02/2013 1:05

## 2014-05-18 NOTE — H&P (Signed)
PATIENT NAME:  Martin Key, Martin Key MR#:  [id number] DATE OF BIRTH:  30-Aug-1940  DATE OF ADMISSION:  05/27/2013  REFERRING PHYSICIAN:  Dr. Lavonia Drafts.   PRIMARY CARE PHYSICIAN: Dr. Frazier Richards at [facility].   PRIMARY CARDIOLOGIST:  Dr. Nehemiah Massed.   CHIEF COMPLAINT: Generalized weakness, dark-colored stools.   HISTORY OF PRESENT ILLNESS: This is a 74 year old male with history of hyperlipidemia, diabetes, hypertension, A. fib, coronary artery disease and congestive heart failure, the patient was discharged May 1st from the hospital. As well, prior to that, he was admitted and discharged on April 24th. This is his third admission within a week. The patient presents with complaints of generalized weakness and dark-colored stools. The patient is known to have history of A. fib, on Eliquis for that, presents with dark-colored stools that he reported has been going on for a month. His hemoglobin is stable at 13.7. A Hemoccult was done by ED physician. The patient had normal brown-colored stools, no melena. It is very mildly Hemoccult positive. As well, the patient had few other labs abnormality, including mildly elevated LFTs with leukocytosis of 18,000. He is afebrile, no fever, negative urinalysis, negative chest x-ray. As well, the patient had been complaining of generalized weakness, but this has been going on for some time and this is most likely related to his multiple recent hospitalizations.   PAST MEDICAL HISTORY: Hyperlipidemia, diabetes, hypertension, A. fib, coronary artery disease, systolic congestive heart failure and questionable history of myasthenia gravis.   SOCIAL HISTORY: Significant for daily tobacco use. No alcohol. No drug usage.   FAMILY HISTORY: Significant for coronary artery disease in multiple family members.   ALLERGIES: No known drug allergies.   HOME MEDICATIONS: Amiodarone 400 mg oral daily, Cardizem extended release 240 mg oral daily, Eliquis 5 mg oral 2 times a  day, mirtazapine 15 mg oral at bedtime, pravastatin 40 mg oral at bedtime, torsemide 10 mg oral daily, omeprazole 20 mg oral 2 times a day.   REVIEW OF SYSTEMS:  CONSTITUTIONAL: Denies fever, chills. Complains of generalized weakness.  EYES: Denies blurry vision, double vision, inflammation.  ENT: Denies tinnitus, ear pain, hearing loss, epistaxis. RESPIRATORY: Denies cough, wheezing, hemoptysis, history of COPD.  CARDIOVASCULAR: Denies chest pain, arrhythmia, palpitations, edema.  GASTROINTESTINAL: Denies nausea, vomiting, diarrhea, abdominal pain, hematemesis. The patient reports dark-colored stools, but no hematemesis, no bright red blood per rectum.   GENITOURINARY: Denies dysuria, hematuria, renal colic.  ENDOCRINE: Denies polyuria, polydipsia, heat or cold intolerance.   HEMATOLOGY: Denies anemia, easy bruising.  INTEGUMENTARY: Denies acne, rash or skin lesion.  MUSCULOSKELETAL: Denies any swelling, gout, cramps.  NEUROLOGIC: Denies history of CVA, TIA, headache, vertigo, tremors.  PSYCHIATRIC:  Denies history of anxiety, insomnia or depression.   PHYSICAL EXAMINATION: VITAL SIGNS: Temperature 97.8, pulse 90, respiratory rate 18, blood pressure 112/71, saturating 96% on room air.  GENERAL: Well-nourished male who looks comfortable, in no apparent distress.  HEENT: Head is atraumatic, normocephalic. Pupils equal, reactive to light. Pink conjunctivae. Anicteric sclerae. Moist oral mucosa.  NECK: Supple. No thyromegaly. No JVD.  CHEST: Good air entry bilaterally. No wheezing, rales, rhonchi.  CARDIOVASCULAR: S1, S2 heard. No rubs, murmurs, gallops, irregularly irregular.  ABDOMEN: Soft, nontender, nondistended. Bowel sounds present.  EXTREMITIES: No edema. No clubbing. No cyanosis. Pedal and radial pulses felt bilaterally  PSYCHIATRIC: The patient appears to be mildly confused, but he is awake, alert, oriented x 3. He knows  that he is in the hospital. He knows it is 7:15. He knows  that  it is May of 2015.   SKIN: Has multiple skin tattoos. No rash.  MUSCULOSKELETAL: No joint effusion or erythema.   PERTINENT LABORATORY DATA: Glucose 150, BNP 21,509. BUN 40, creatinine 1.1. Sodium 144, potassium 3.6, chloride 104. CO2 28. ALT 178, AST 125, alk phos 121. White blood cells 18.6, hemoglobin 13.9, hematocrit 44.7, platelets 149. Urinalysis negative for leukocyte esterase or nitrites. Lactic acid 2.4.   IMAGING STUDIES: CT head without contrast: No evidence of acute intracranial abnormality   CHEST X-RAY: No acute cardiopulmonary disease.   ABDOMINAL ULTRASOUND: Sludge and gallstones in the gallbladder without evidence of cholecystitis. No biliary obstruction. Borderline hepatomegaly with nodular contours, prominent portal vein and small amount of ascites findings raise the possibility of cirrhosis and right pleural effusion.   ASSESSMENT AND PLAN: 1.  Generalized weakness. This is most likely due to deconditioning. The patient has no specific focal deficits. CT head is negative. Will consult physical therapy service as well. The patient might need placement in subacute rehabilitation, as he is having multiple recurrent admissions; this is his third admission within a week.  2.  Elevated liver function tests. Ultrasound showing possible cirrhosis.  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 We will check hepatitis panel.  Gastroenterology will be consulted.  3. Leukocytosis. The patient has negative urinalysis and chest x-ray. We will check blood cultures. He is afebrile, will monitor closely.  4. Complains of dark-colored stools with Hemoccult being mildly positive. Will hold his liquids. We will consult gastroenterology. Will keep him on p.o. Protonix b.i.d. Patient's hemoglobin is stable, does not appear to be having significant gastrointestinal bleed.  5. Chronic systolic congestive heart failure with known ejection fraction less than 20%.  Continue with diuresis. 6. History of atrial fibrillation, rate controlled. Will hold Eliquis until patient is seen by gastroenterology. .  7.   Hypertension. Blood pressure is acceptable. Continue with home medications.  8.   Hyperlipidemia. Continue with statin.  9. Deep vein thrombosis prophylaxis. The patient was on Eliquis, so will hold off on  anticoagulation until patient is seen by gastroenterology. Meanwhile, he will be kept on sequential compression devices and thromboembolic-deterrent hose.  10.  CODE STATUS: Full code.  Total time spent on admission and patient care: 55 minutes.     ____________________________ Albertine Patricia, MD dse:aj D: 05/27/2013 01:07:37 ET T: 05/27/2013 02:39:43 ET JOB#: [id number]  cc: Albertine Patricia, MD, <Dictator> Ocie Cornfield. Ouida Sills, MD  Dalbert Stillings Graciela Husbands MD ELECTRONICALLY SIGNED 05/28/2013 0:07

## 2014-05-18 NOTE — Consult Note (Signed)
PATIENT NAME:  Martin Key, Martin Key MR#:  [id number] DATE OF BIRTH:  [birth year]  DATE OF CONSULTATION:  05/25/2013  REFERRING PHYSICIAN:   CONSULTING PHYSICIAN:  Marcina MillardAlexander Abhiraj Dozal, MD  PRIMARY CARE PHYSICIAN: Dr. Dareen PianoAnderson.  CARDIOLOGIST: Dr. Gwen PoundsKowalski.  CHIEF COMPLAINT: "I got dizzy."    HISTORY OF PRESENT ILLNESS: The patient is a 74 year old gentleman with history of Nonischemic dilated cardiomyopathy, chronic systolic congestive heart failure and atrial fibrillation. The patient was recently hospitalized with respiratory failure, multifactorial with congestive heart failure and atrial fibrillation with rapid ventricular rate. The patient was discharged on metoprolol and Cardizem. He was in his usual state of health until the day of admission when he experienced dizziness when he just walked out onto the porch. [facility] where he was noted to be in atrial flutter, had 2:1 block with a rate of 120 bpm. The patient was treated with intravenous doses of diltiazem and metoprolol and was admitted to telemetry. Upon my advise, the patient was started on amiodarone 400 mg b.i.d. The patient reports feeling much better. Denies dizziness. Heart rates are now at 100 bpm.   PAST MEDICAL HISTORY:  1.  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 2.  Chronic congestive heart failure.  3.  Atrial fibrillation.  4.  Hypertension.  5.  Diabetes.  6.  Hyperlipidemia.  7.  COPD.  MEDICATIONS ON ADMISSION: Metoprolol succinate 200 mg daily, Cardizem XT 240 mg daily, pravastatin 40 mg daily, aspirin 81 mg daily, torsemide 10 mg daily, potassium 10 mEq daily and prednisone taper.   SOCIAL HISTORY: The patient currently lives in a duplex with his girlfriend. He quit tobacco abuse 18 days ago.   FAMILY HISTORY: Positive for coronary artery disease.   REVIEW OF SYSTEMS: CONSTITUTIONAL: The patient does have generalized weakness and fatigue. EYES: No blurry vision. EARS: No  hearing loss. RESPIRATORY: Shortness of breath as described above. CARDIOVASCULAR: Tachycardia as described above. GASTROINTESTINAL: The patient has nausea. GENITOURINARY: No dysuria or hematuria.  ENDOCRINE: No polyuria or polydipsia. MUSCULOSKELETAL: No arthralgias or myalgias.  NEUROLOGICAL: No focal muscle weakness or numbness. PSYCHOLOGICAL: No depression or anxiety.   PHYSICAL EXAMINATION:  VITAL SIGNS: Blood pressure 122/83, pulse 100 and regular, respirations 18, temperature 98, pulse oximetry 96%.  HEENT: Pupils equal, reactive to light and accommodation.  NECK: Supple without thyromegaly.  LUNGS: Revealed intermittent expiratory wheezes.  CARDIOVASCULAR: Normal JVP. Normal PMI. Regular rate and rhythm. Normal S1, S2. No appreciable gallop, murmur, or rub.  ABDOMEN: Soft and nontender.  EXTREMITIES: There is trace pedal edema.  MUSCULOSKELETAL: Normal muscle tone.  NEUROLOGIC: The patient is alert and oriented x 3. Motor and sensory both grossly intact.   IMPRESSION: A 74 year old gentleman with known dilated cardiomyopathy, chronic systolic congestive heart failure, chronic atrial fibrillation, who presents after an episode of dizziness. He was noted to be in atrial flutter with 2:1 block at a rate of 120 bpm. Review of old records shows the patient has had atrial fibrillation with rates above 100 bpm.   RECOMMENDATIONS:  1.  Add amiodarone 400 mg daily.  2.  Continue metoprolol and Cardizem for now. He will likely need to taper Cardizem and/or metoprolol as the patient is being loaded with amiodarone. 3.  Counseled the patient about the importance of a low-sodium diet.  4.  Strongly encouraged the patient to continue to abstain from tobacco abuse.  5.  Follow up with Dr. Gwen PoundsKowalski as an outpatient.   ____________________________ Marcina MillardAlexander Hearl Heikes, MD ap:aw  D: 05/25/2013 08:52:30 ET T: 05/25/2013 09:02:50 ET JOB#: [id number]  cc: Marcina Millard, MD, <Dictator> Marcina Millard MD ELECTRONICALLY SIGNED 05/29/2013 12:47

## 2014-05-18 NOTE — Consult Note (Signed)
PATIENT NAME:  [name], Martin Key DATE OF BIRTH:  March 20, 1940  DATE OF CONSULTATION:  05/27/2013  REFERRING PHYSICIAN:   CONSULTING PHYSICIAN:  Martin MillardAlexander Corneluis Allston, MD  PRIMARY CARE PHYSICIAN: Marya AmslerMarshall W. Dareen PianoAnderson, MD  PRIMARY CARDIOLOGIST: Lamar BlinksBruce J. Kowalski, MD   CHIEF COMPLAINT: Weakness.   REASON FOR CONSULTATION: Consultation requested for evaluation of elevated troponin.   HISTORY OF PRESENT ILLNESS: The patient is a 74 year old gentleman with history of coronary artery disease, recurrent congestive heart failure, hypertension, hyperlipidemia and diabetes. The patient was recently hospitalized and discharged on 05/25/2013 for acute on chronic congestive heart failure. Following discharge, the patient experienced generalized weakness and dark-colored stools and [facility]present[facility]. Hemoccult was positive. Admission labs were notable for borderline elevated troponin of 0.1.   PAST MEDICAL HISTORY: 1.  Coronary artery disease.  2.  Chronic systolic congestive heart failure.  3.  Atrial fibrillation.  4.  Hypertension.  5.  Hyperlipidemia.  6.  Diabetes.   MEDICATIONS ON ADMISSION: Amiodarone 400 mg daily, Cardizem XT 240 mg daily, pravastatin 40 mg at bedtime, furosemide 10 mg daily, omeprazole 20 mg b.i.d., mirtazapine 50 mg at bedtime.   SOCIAL HISTORY: The patient currently denies tobacco or EtOH abuse.   FAMILY HISTORY: Positive for coronary artery disease.   REVIEW OF SYSTEMS:    CONSTITUTIONAL: The patient denies fever or chills.  EYES: No blurry vision.  EARS: No hearing loss.  RESPIRATORY: The patient does have exertional dyspnea.  CARDIOVASCULAR: The patient has intermittent chest tightness.  GASTROINTESTINAL: The patient has had dark tarry stools.  GENITOURINARY: No dysuria or hematuria.  ENDOCRINE: No polyuria or polydipsia.  HEMATOLOGICAL: No easy bruising or bleeding.  INTEGUMENTARY: No rash.  MUSCULOSKELETAL: No arthralgias or  myalgias.  NEUROLOGICAL: No focal muscle weakness or numbness.  PSYCHOLOGICAL: No depression or anxiety.   PHYSICAL EXAMINATION: VITAL SIGNS: Blood pressure 120/83, pulse 79, respirations 18, temperature 98.2, pulse oximetry 92%.  HEENT: Pupils equal and reactive to light and accommodation.  NECK: Supple without thyromegaly.  LUNGS: Clear.  HEART: Normal JVP. Normal PMI. Regular rate and rhythm. Normal S1, S2. No appreciable gallop, murmur or rub.  ABDOMEN: Soft and nontender. Pulses were intact bilaterally.  MUSCULOSKELETAL: Normal muscle tone.  NEUROLOGIC: The patient is alert and oriented x 3. Motor and sensory both grossly intact.   IMPRESSION: A 74 year old gentleman with known coronary artery disease, chronic systolic congestive heart failure, atrial fibrillation, currently on Eliquis. The patient presents with generalized weakness and black stools consistent with gastrointestinal bleed with Hemoccult positive stools. The patient has borderline elevated troponin which is likely due to demand/supply ischemia and not due to acute coronary syndrome.   RECOMMENDATIONS: 1.  Agree with overall current therapy.  2.  Agree with holding Eliquis until further evaluation by GI.  3.  Would defer full-dose anticoagulation at this time.  4.  Would defer further cardiac diagnostics at this time.   ____________________________ Martin MillardAlexander Ozro Russett, MD ap:cs D: 05/27/2013 11:57:12 ET T: 05/27/2013 15:34:03 ET JOB#: [id number]  cc: Martin MillardAlexander Bev Drennen, MD, <Dictator> Martin MillardALEXANDER Cleland Simkins MD ELECTRONICALLY SIGNED 06/25/2013 15:02

## 2014-05-18 NOTE — Discharge Summary (Signed)
PATIENT NAME:  Martin Key, Martin Key MR#:  [id number] DATE OF BIRTH:  04/23/1940  DATE OF ADMISSION:  05/27/2013 DATE OF DISCHARGE: 05/30/2013   DISCHARGE DIAGNOSES:  1.  Acute gastrointestinal bleed on Eliquis.  2.  Atrial fibrillation, bleeding on Eliquis.  3.  Encephalopathy from multiple recent illness.  4.  Chronic obstructive pulmonary disease, recent exacerbation, now clearing.  5.  Cardiomyopathy with recent normal systolic function without congestive heart failure, this admission.  6.  Failure to thrive at home with the above with falls going home.   DISCHARGE MEDICATIONS: [facility] med [facility]. Please see for details: Will be off of Eliquis. He is not on metoprolol that he had been on. His heart rate has been reasonably well controlled, typically no more than 110 and with his blood pressure on low side, we will continue current regimen.   HISTORY AND PHYSICAL: Please see detailed history and physical done on admission.   HOSPITAL COURSE: The patient was admitted a day after discharge. He had been going through this same experience prior. He could not walk well at home. At this time, he was found to have a GI bleed on Eliquis. Colonoscopy showed multiple polyps, but a poor prep. No bleeding since admission and off the Eliquis. He remains confused notably with a normal B12 and TSH on the previous admission earlier in the month. He does agree to rehabilitation and his girlfriend who he lives with his was not comfortably with him at home despite wanting to go there if at all possible. 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 He is back close to normal with a ALT of 96 and an AST of 46, which is barely above normal now. He seems to be tolerating the low dose of amiodarone. He had some gallstones, but no frank abdominal pain. GI did not recommend cholecystectomy at this point. If he has recurrent abdominal pain, given the gallstones, he may need to have  this. His creatinine was 1.09 today's date with a BUN of 18. Please watch that closely. Decrease his torsemide if necessary. Leukocytosis is slowly improving. It is down to almost normal at 11.5. He does have a mildly elevated bilirubin at 1.1, which is minimally abnormal and mostly chronic. Hepatitis C antibody was normal. Hepatitis B antigen was negative. Hopefully he will be able to return home with increasing strength after his acute illness and clearing of the encephalopathy that has resulted from all of his recent illnesses.   TIME SPENT: It took approximately 33 minutes to do all discharge tasks.  ____________________________ Marya AmslerMarshall W. Dareen PianoAnderson, MD mwa:aw D: 05/30/2013 07:46:54 ET T: 05/30/2013 08:24:42 ET JOB#: [id number]  cc: Marya AmslerMarshall W. Dareen PianoAnderson, MD, <Dictator> Lauro RegulusMARSHALL W Keelynn Furgerson MD ELECTRONICALLY SIGNED 06/01/2013 6:18

## 2014-05-18 NOTE — Consult Note (Signed)
   Present Illness 74 yo male with history of afib treated medically without anticoagulation due to pt deferring this, hypertension, diabetes mellitus, hyperlipidemia and known moderate diffuse cad by cath in 2012 admitted with progressive shortness of breath. He has a cardiomyopathy with ef of 30% with severe mr and biatrial enlargement. CXR shows pulmonary edema. He has ruled out for an mi thus far. 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 He states he has been compiant with meds.   Physical Exam:  GEN well nourished   HEENT PERRL, hearing intact to voice   NECK No masses   RESP no use of accessory muscles  rhonchi  crackles   CARD Irregular rate and rhythm  Murmur   Murmur Systolic   Systolic Murmur axilla   ABD denies tenderness  normal BS  no Abdominal Bruits   LYMPH negative neck, negative axillae   EXTR negative cyanosis/clubbing, positive edema   SKIN normal to palpation   NEURO cranial nerves intact, motor/sensory function intact   PSYCH A+O to time, place, person   Review of Systems:  Subjective/Chief Complaint sob   General: Fatigue  Weakness   Skin: No Complaints   ENT: No Complaints   Eyes: No Complaints   Neck: No Complaints   Respiratory: Short of breath   Cardiovascular: Dyspnea  Edema   Gastrointestinal: No Complaints   Genitourinary: No Complaints   Vascular: No Complaints   Musculoskeletal: No Complaints   Neurologic: No Complaints   Hematologic: No Complaints   Endocrine: No Complaints   Psychiatric: No Complaints   Review of Systems: All other systems were reviewed and found to be negative   Medications/Allergies Reviewed Medications/Allergies reviewed   Family & Social History:  Family and Social History:  Family History Coronary Artery Disease  Diabetes Mellitus   Social History negative tobacco, negative ETOH, negative Illicit drugs   Place of Living Home   EKG:  Interpretation afib with  controlled vr    No Known Allergies:    Impression 74 yo male with history of cad, afib, hypertension, hyperlipidemia now admitted with progressive shortness of breath and noted to be in acute on chronic systollic chf. Echo revealed severely reduced lv funciton with ef of 20% His afib has not been anticoagulated due to patient preference and fall risk.Will continue to diurese and follow for improvement. Continue with cartia xt and metoprolol for rate control.   Plan 1. Continue aggressive diuresis and follow renal funciton and clinical improvement 2. Continue cartia and metoprolol for now for rate control 3. Rule out fo rmi 4. Further recs pending clinical improvment.   Electronic Signatures: Dalia HeadingFath, Khyree Carillo A (MD)  (Signed 26-Apr-15 07:58)  Authored: General Aspect/Present Illness, History and Physical Exam, Review of System, Family & Social History, EKG , Allergies, Impression/Plan   Last Updated: 26-Apr-15 07:58 by Dalia HeadingFath, Khamila Bassinger A (MD)

## 2014-05-18 NOTE — Consult Note (Signed)
Chief Complaint:  Subjective/Chief Complaint Pt denies abdominal pain, nausea, vomiting or melena.   VITAL SIGNS/ANCILLARY NOTES: **Vital Signs.:   04-May-15 06:10  Vital Signs Type Routine  Temperature Temperature (F) 98.2  Celsius 36.7  Temperature Source oral  Pulse Pulse 92  Respirations Respirations 20  Systolic BP Systolic BP 95  Diastolic BP (mmHg) Diastolic BP (mmHg) 61  Mean BP 72  Pulse Ox % Pulse Ox % 92  Pulse Ox Activity Level  At rest  Oxygen Delivery Room Air/ 21 %    08:18  Vital Signs Type Pre Medication  Pulse Pulse 125  Systolic BP Systolic BP 99  Diastolic BP (mmHg) Diastolic BP (mmHg) 67  Mean BP 77   Brief Assessment:  GEN well developed, well nourished, no acute distress, A/Ox3   Cardiac Regular   Gastrointestinal Normal   Gastrointestinal details normal Soft  Nontender  Nondistended  No masses palpable   EXTR negative edema, Multiple ecchymoses   Lab Results: Hepatic:  04-May-15 05:35   Bilirubin, Total  1.3  Bilirubin, Direct  0.4 (Result(s) reported on 28 May 2013 at 07:12AM.)  Alkaline Phosphatase 97 (45-117 NOTE: New Reference Range 12/15/12)  SGPT (ALT)  145  SGOT (AST)  79  Total Protein, Serum 6.4  Albumin, Serum  2.9  Routine Hem:  04-May-15 05:35   WBC (CBC)  13.9  RBC (CBC) 4.72  Hemoglobin (CBC) 13.4  Hematocrit (CBC) 42.2  Platelet Count (CBC)  118  MCV 89  MCH 28.5  MCHC  31.9  RDW  16.7  Neutrophil % 85.1  Lymphocyte % 7.3  Monocyte % 4.1  Eosinophil % 3.0  Basophil % 0.5  Neutrophil #  11.8  Lymphocyte # 1.0  Monocyte # 0.6  Eosinophil # 0.4  Basophil # 0.1 (Result(s) reported on 28 May 2013 at 07:05AM.)   Assessment/Plan:  Assessment/Plan:  Assessment Anemia:  Hemoccult positive stool.  Pt cleared for procedures by cardiology.  Discussed risks/benefits of procedure which include but are not limited to bleeding, infection, perforation & drug reaction.  Patient agrees with this plan & consent will be  obtained.   Plan 1) Clear liquids, NPO after MN 2) Standard prep today 3) Colonoscopy & EGD tomorrow by Dr Servando SnareWohl Please call if you have any questions or concerns   Electronic Signatures: Joselyn ArrowJones, Zayna Toste L (NP)  (Signed [phone]-May-15 09:31)  Authored: Chief Complaint, VITAL SIGNS/ANCILLARY NOTES, Brief Assessment, Lab Results, Assessment/Plan   Last Updated: 04-May-15 09:31 by Joselyn ArrowJones, Zacharie Portner L (NP)

## 2014-05-18 NOTE — Consult Note (Signed)
Chief Complaint:  Subjective/Chief Complaint Pt still slightly sob denies cp. Seem a little confused.   VITAL SIGNS/ANCILLARY NOTES: **Vital Signs.:   28-Apr-15 12:08  Vital Signs Type Routine  Temperature Temperature (F) 97.6  Celsius 36.4  Temperature Source oral  Pulse Pulse 110  Respirations Respirations 21  Systolic BP Systolic BP 737  Diastolic BP (mmHg) Diastolic BP (mmHg) 70  Mean BP 80  Pulse Ox % Pulse Ox % 94  Pulse Ox Activity Level  At rest  Oxygen Delivery Room Air/ 21 %  *Intake and Output.:   Shift 28-Apr-15 15:00  Grand Totals Intake:  240 Output:  800    Net:  -560 24 Hr.:  -560  Oral Intake      In:  240  Urine ml     Out:  800  Length of Stay Totals Intake:  1080 Output:  1062    Net:  -2815   Brief Assessment:  GEN well developed, well nourished, no acute distress   Cardiac Irregular  murmur present  -- thrills   Respiratory normal resp effort  clear BS  rhonchi   Gastrointestinal Normal   Gastrointestinal details normal Soft  Nontender  Nondistended  Bowel sounds normal   Lab Results: Routine Chem:  26-Apr-15 04:10   BUN 18  Creatinine (comp) 1.06  Sodium, Serum 141  Potassium, Serum  3.3  Chloride, Serum 104  CO2, Serum 31  Calcium (Total), Serum 8.8  Anion Gap  6  Osmolality (calc) 283  eGFR (African American) >60  eGFR (Non-African American) >60 (eGFR values <58m/min/1.73 m2 may be an indication of chronic kidney disease (CKD). Calculated eGFR is useful in patients with stable renal function. The eGFR calculation will not be reliable in acutely ill patients when serum creatinine is changing rapidly. It is not useful in  patients on dialysis. The eGFR calculation may not be applicable to patients at the low and high extremes of body sizes, pregnant women, and vegetarians.)  Routine Hem:  26-Apr-15 04:10   WBC (CBC)  11.5  RBC (CBC)  4.22  Hemoglobin (CBC)  12.2  Hematocrit (CBC)  37.4  Platelet Count (CBC)  130  MCV 89   MCH 28.9  MCHC 32.6  RDW  16.0  Neutrophil % 85.1  Lymphocyte % 9.0  Monocyte % 5.2  Eosinophil % 0.4  Basophil % 0.3  Neutrophil #  9.8  Lymphocyte # 1.0  Monocyte # 0.6  Eosinophil # 0.0  Basophil # 0.0 (Result(s) reported on 20 May 2013 at 05:31AM.)   Radiology Results: XRay:    24-Apr-15 21:51, Chest Portable Single View  Chest Portable Single View   REASON FOR EXAM:    weakness/chest pain  COMMENTS:       PROCEDURE: DXR - DXR PORTABLE CHEST SINGLE VIEW  - May 18 2013  9:51PM     CLINICAL DATA:  Weakness, chest pain    EXAM:  PORTABLE CHEST - 1 VIEW    COMPARISON:  Portable exam 2142 hr compared to CT chest 10/24/2007    FINDINGS:  Enlargement of cardiac silhouette with pulmonary vascular  congestion.  Mediastinal contours normal.    Bronchitic changes with bibasilar atelectasis.    No definite infiltrate, pleural effusion or pneumothorax.    Osseous structures unremarkable.     IMPRESSION:  Enlargement of cardiac silhouette with pulmonary vascular  congestion.    Bibasilar atelectasis.    Electronically Signed    By: MLavonia DanaM.D.    On:  05/18/2013 21:58         Verified By: Burnetta Sabin, M.D.,  Cardiology:    24-Apr-15 21:21, ED ECG  Ventricular Rate 112  Atrial Rate 120  QRS Duration 96  QT 386  QTc 526  R Axis -29  T Axis 43  ECG interpretation   Accelerated Junctional rhythm with frequent Premature ventricular complexes and Fusion complexes  Inferior infarct , age undetermined  Abnormal ECG  When compared with ECG of 27-Sep-2012 13:26,  Junctional rhythm has replaced Atrial fibrillation  ST elevation has replaced ST depression in Inferior leads  ----------unconfirmed----------  Confirmed by OVERREAD, NOT ([id number]), editor PEARSON, BARBARA ([id number]) on 05/21/2013 2:20:00 PM  ED ECG     25-Apr-15 07:28, Echo Doppler  Echo Doppler   REASON FOR EXAM:      COMMENTS:       PROCEDURE: Ross - ECHO DOPPLER COMPLETE(TRANSTHOR)  - May 19 2013   7:28AM     RESULT: Echocardiogram Report    Patient Name:   Martin Key Date of Exam: 05/19/2013  Medical Rec #:  [id number]      Custom1:  Date of Birth:  02/14/1940   Height:       67.0 in  Patient Age:    74 years    Weight:       176.0 lb  Patient Gender: M           BSA:          1.92 m??    Indications: CHF  Sonographer:    Arville Go RDCS  Referring Phys: Frazier Richards, W    Summary:   1. Left ventricular ejection fraction, by visual estimation, is <20%.   2. Severely decreased global left ventricular systolic function.   3. Mildly dilated left atrium.   4. Moderate mitral valve regurgitation.   5. Mildly increased left ventricular internal cavity size.   6. Moderate tricuspid regurgitation.  2D AND M-MODE MEASUREMENTS (normal ranges within parentheses):  Left Ventricle:          Normal  IVSd (2D):      1.05 cm (0.7-1.1)  LVPWd (2D):     1.09 cm (0.7-1.1) Aorta/LA:     Normal  LVIDd (2D):     5.73 cm (3.4-5.7) Aortic Root (2D): 3.10 cm (2.4-3.7)  LVIDs (2D):     5.29 cm           Left Atrium (2D): 4.80 cm (1.9-4.0)  LV FS (2D):      7.7 %   (>25%)  LV EF (2D):     16.8 %   (>50%)      Right Ventricle:                                    RVd (2D):  LV DIASTOLIC FUNCTION:  MV Peak E: 1.31 m/s  SPECTRAL DOPPLER ANALYSIS (where applicable):  Aortic Valve: AoV Max Vel: 1.01 m/s AoV Peak PG: 4.1 mmHg AoV Mean PG:  LVOT Vmax:  LVOT VTI:  LVOT Diameter: 2.20 cm  Tricuspid Valve and PA/RV Systolic Pressure: TR Max Velocity: 2.48 m/s RA   Pressure: 5 mmHg RVSP/PASP: 29.6 mmHg  Pulmonic Valve:  PV Max Velocity: 0.57 m/s PV Max PG: 1.3 mmHg PV Mean PG:    PHYSICIAN INTERPRETATION:  Left Ventricle: The left ventricular internal cavity size was mildly   increased. LV posterior wall thickness was normal. Global  LV systolic   function was severely decreased. Left ventricular ejection fraction, by   visual estimation, is <20%.  Right Ventricle: The right ventricular size is  normal. Global RV systolic   function is normal.  Left Atrium: The left atrium is mildly dilated.  Mitral Valve: Moderate mitral valve regurgitation is seen.  Tricuspid Valve: Moderate tricuspid regurgitation is visualized. The   tricuspid regurgitant velocity is 2.48 m/s, and with an assumed right   atrial pressure of 5 mmHg, the estimated right ventricular systolic   pressure is normal at 29.6 mmHg.  Aortic Valve: The aortic valve is tricuspid. The aortic valve is   structurally normal, with no evidence of sclerosis or stenosis.  [place] MD  Electronically signed by [id number]ome Bill MD  Signature Date/Time: 05/19/2013/7:56:53 AM    *** Final ***    IMPRESSION: .        Verified By: Teodoro Spray, M.D., MD   Assessment/Plan:  Assessment/Plan:  Assessment IMP Resp Failure CHF AFIB MG Bronchitis AMS .   Plan PLAN Tele Continue rate control Short term anticoug Inhalers for sob Agree with diuretics F/U lytes AMS seems to be improving slightly Medical therapy for AFIB->NSR   Electronic Signatures: Lujean Amel D (MD)  (Signed 28-Apr-15 20:14)  Authored: Chief Complaint, VITAL SIGNS/ANCILLARY NOTES, Brief Assessment, Lab Results, Radiology Results, Assessment/Plan   Last Updated: 28-Apr-15 20:14 by Lujean Amel D (MD)

## 2014-08-23 IMAGING — CR DG CHEST 1V
1 series · 2 of 2 positions shown · non-contrast
Comparison: 05/26/2013 and prior radiographs.

CLINICAL DATA: 72-year-old male with chest and left rib pain
following fall.

EXAM:
CHEST - 1 VIEW

[Series 1: t chest supine · 0.14mm/px · 2 of 2 slices shown]
[im 1/2]
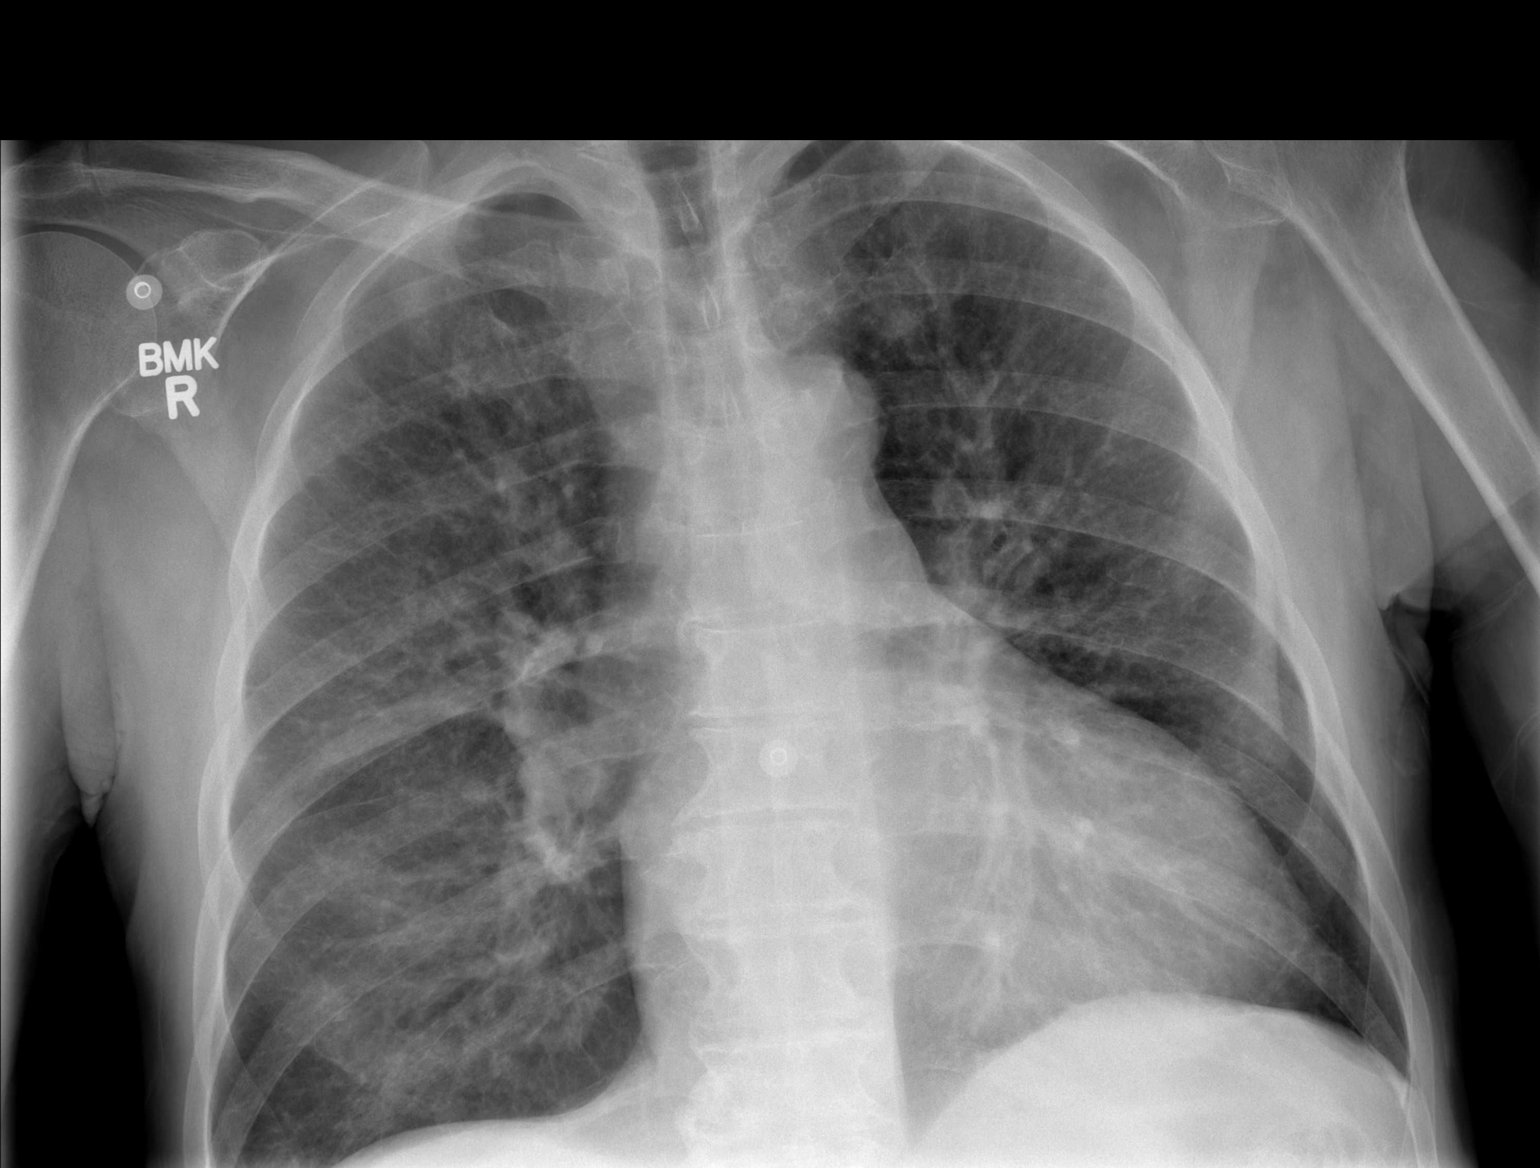
[im 2/2]
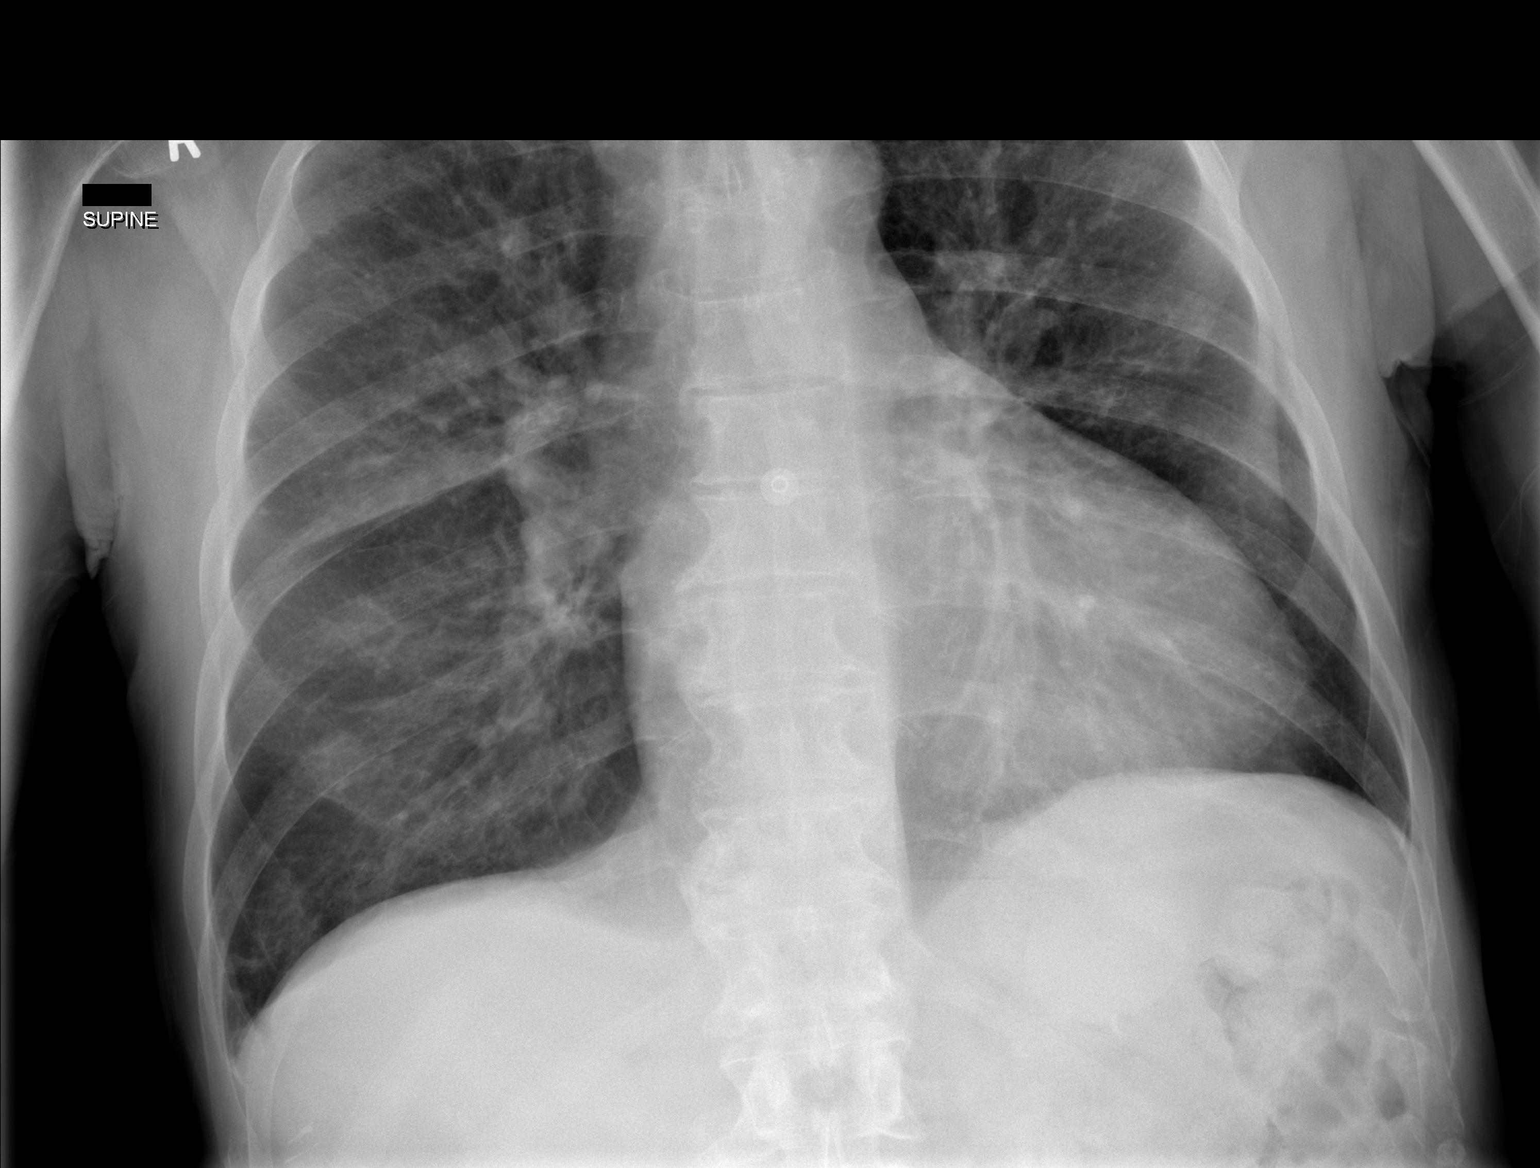

[2 of 2 positions shown; findings below may reference images not displayed]

FINDINGS: Upper limits normal heart size again identified.

Mild pulmonary vascular congestion noted. Very mild interstitial
opacities could represent interstitial edema.

There is no evidence of focal airspace disease, suspicious pulmonary
nodule/mass, pleural effusion, or pneumothorax.

No acute bony abnormalities are identified.
IMPRESSION: Mild pulmonary vascular congestion with possible very mild
interstitial edema.

Upper limits normal heart size.

## 2014-08-23 IMAGING — CR PELVIS - 1-2 VIEW
1 series · 2 of 2 positions shown · non-contrast
Comparison: None.

CLINICAL DATA: Multiple falls.  Left hip pain.

EXAM:
PELVIS - 1-2 VIEW

[Series 1: t pelvis ap · 0.14mm/px · 2 of 2 slices shown]
[im 1/2]
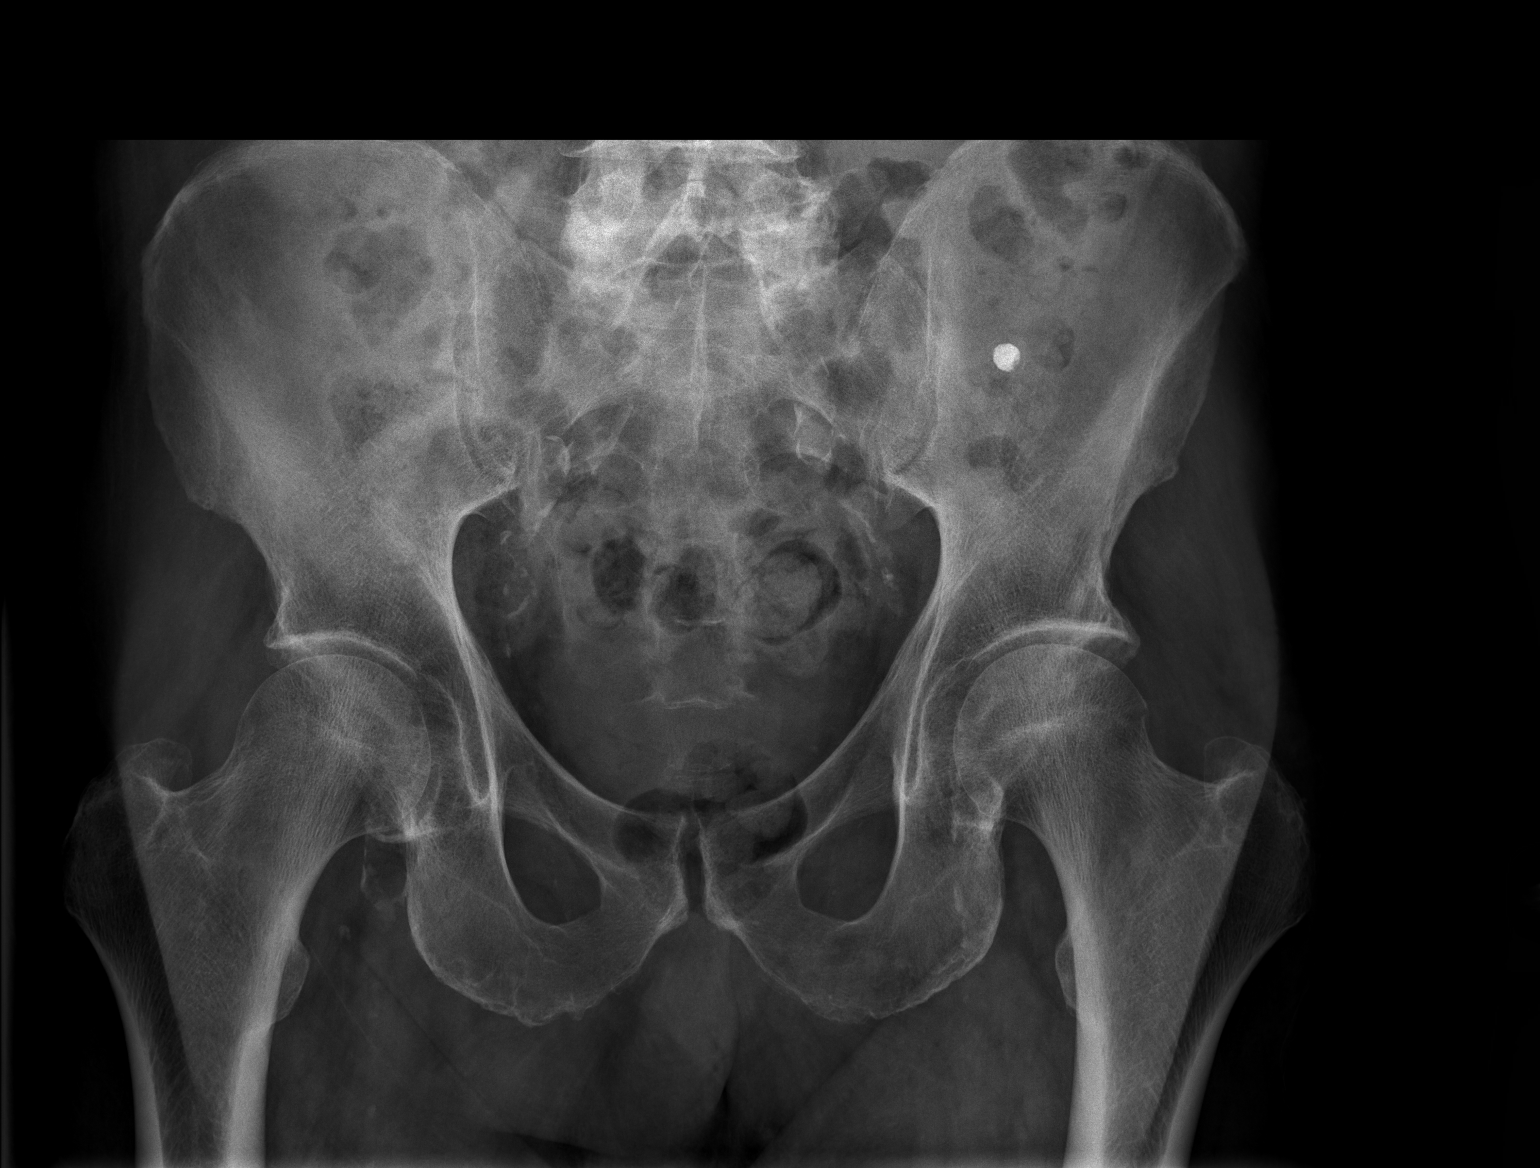
[im 2/2]
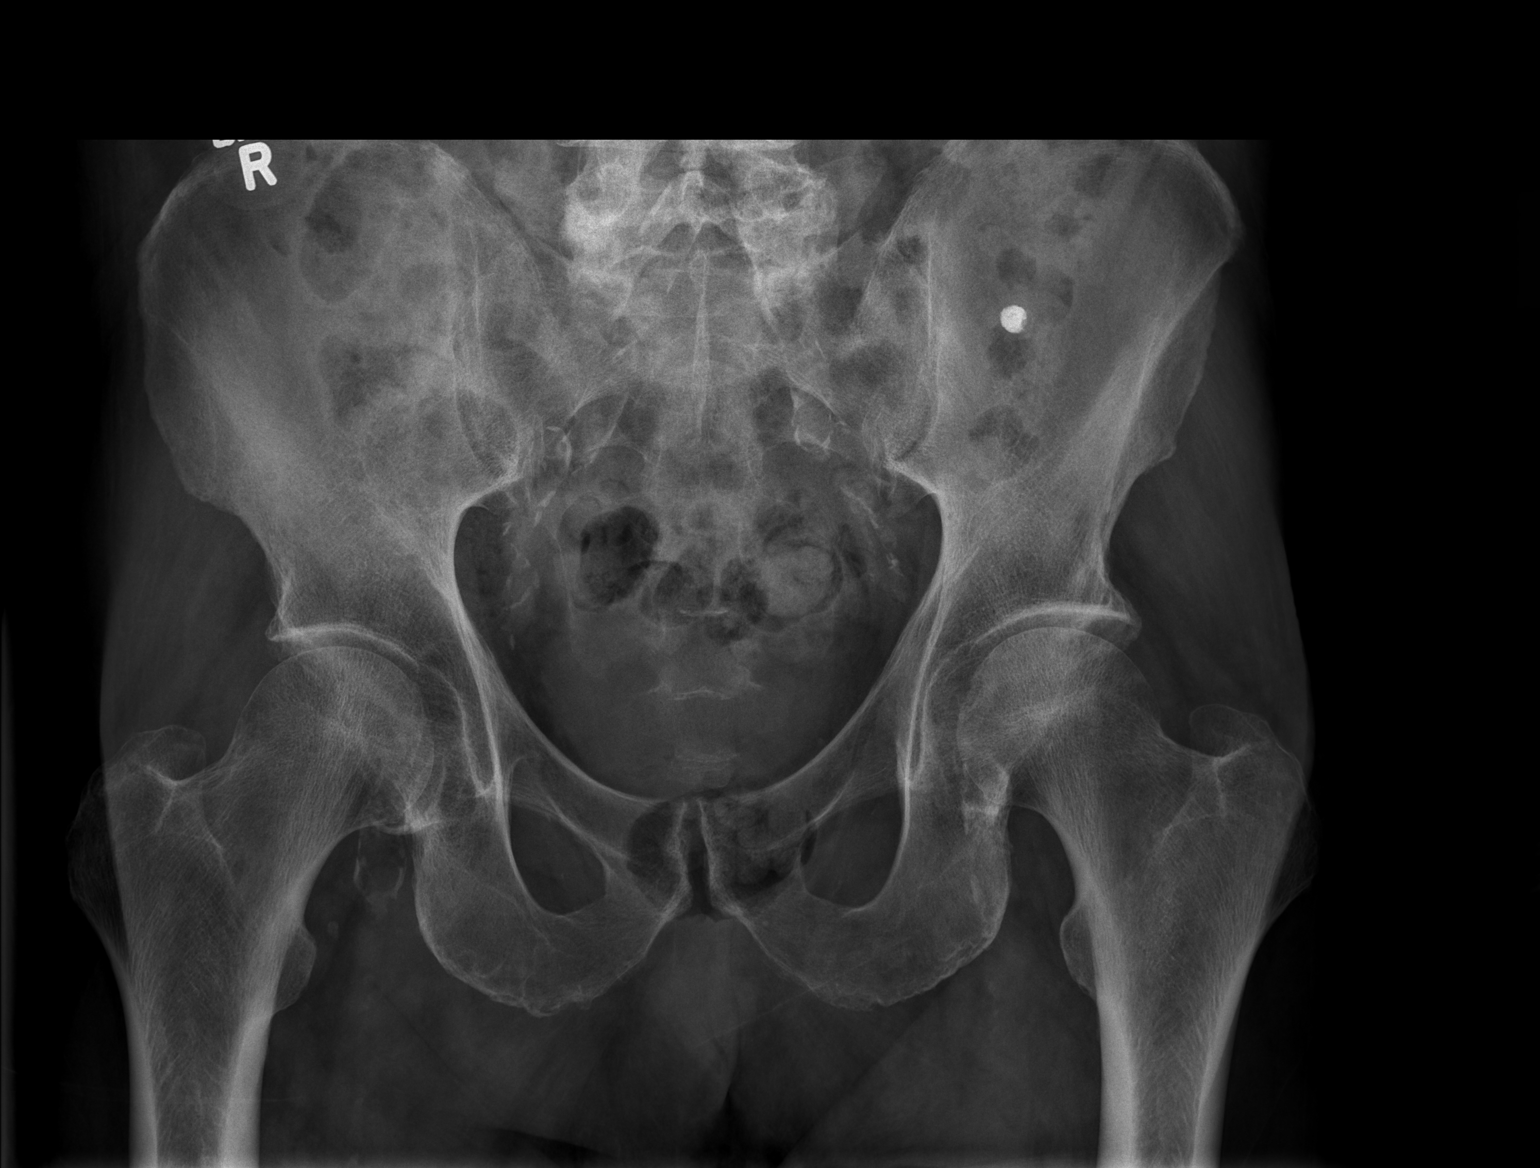

[2 of 2 positions shown; findings below may reference images not displayed]

FINDINGS: No fracture. Hip joints and SI joints and symphysis pubis are
normally spaced and aligned. No arthropathic changes.

Bones are demineralized. There are dense iliac and femoral artery
vascular calcifications.
IMPRESSION: No fracture or acute finding.

## 2014-08-23 IMAGING — CR DG LUMBAR SPINE 2-3V
1 series · 3 of 3 positions shown · non-contrast
Comparison: None.

CLINICAL DATA: Lower back pain after fall.

EXAM:
LUMBAR SPINE - 2-3 VIEW

[Series 1: t lumbar spine ap · 0.14mm/px · 3 of 3 slices shown]
[im 1/3]
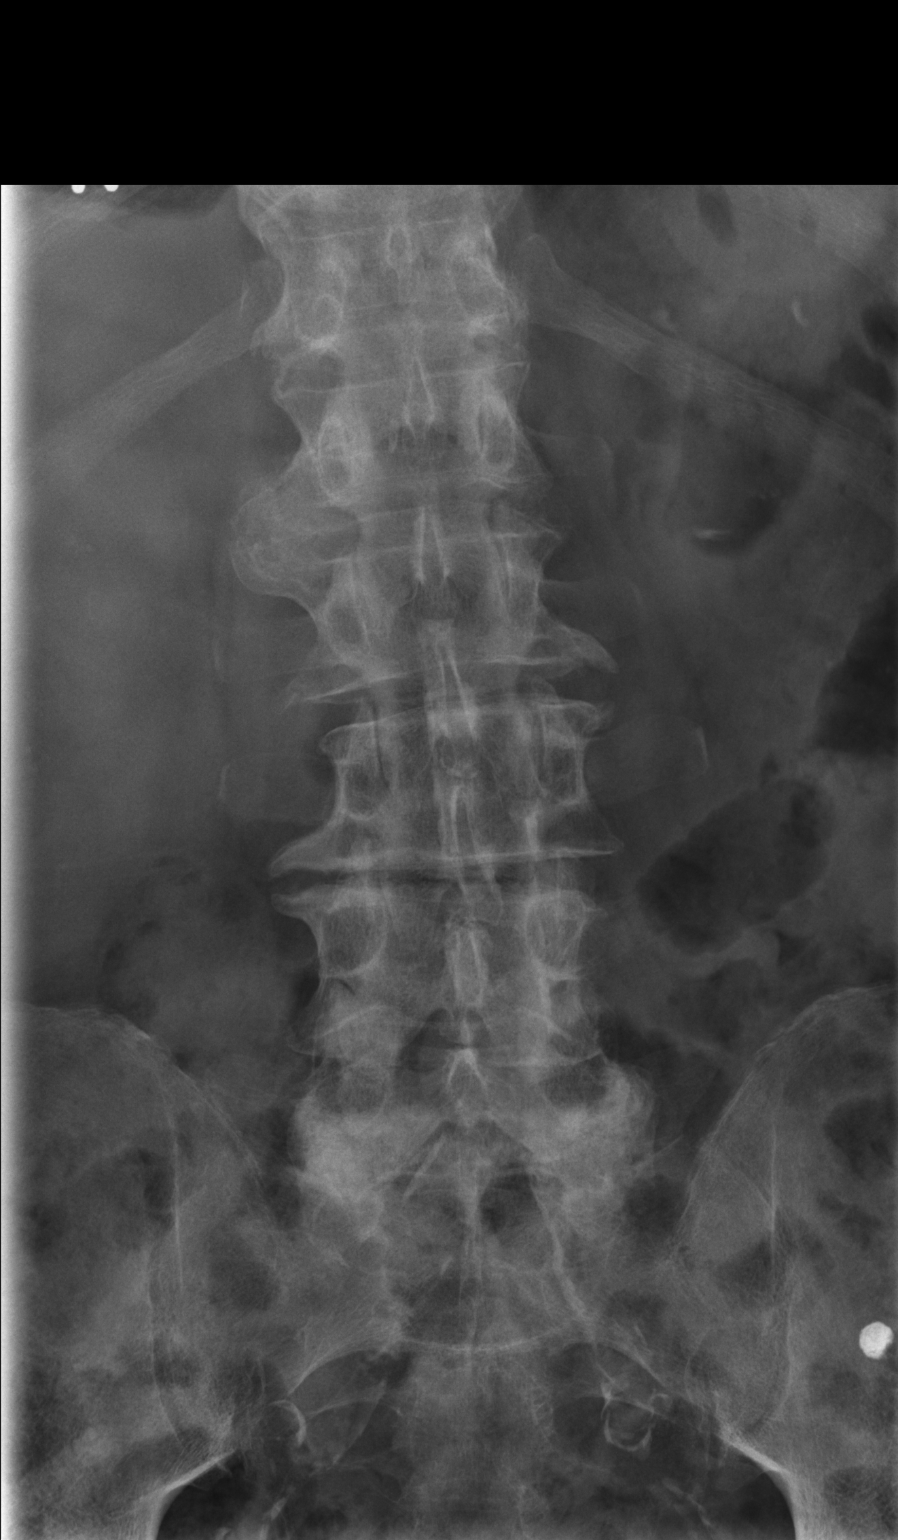
[im 2/3]
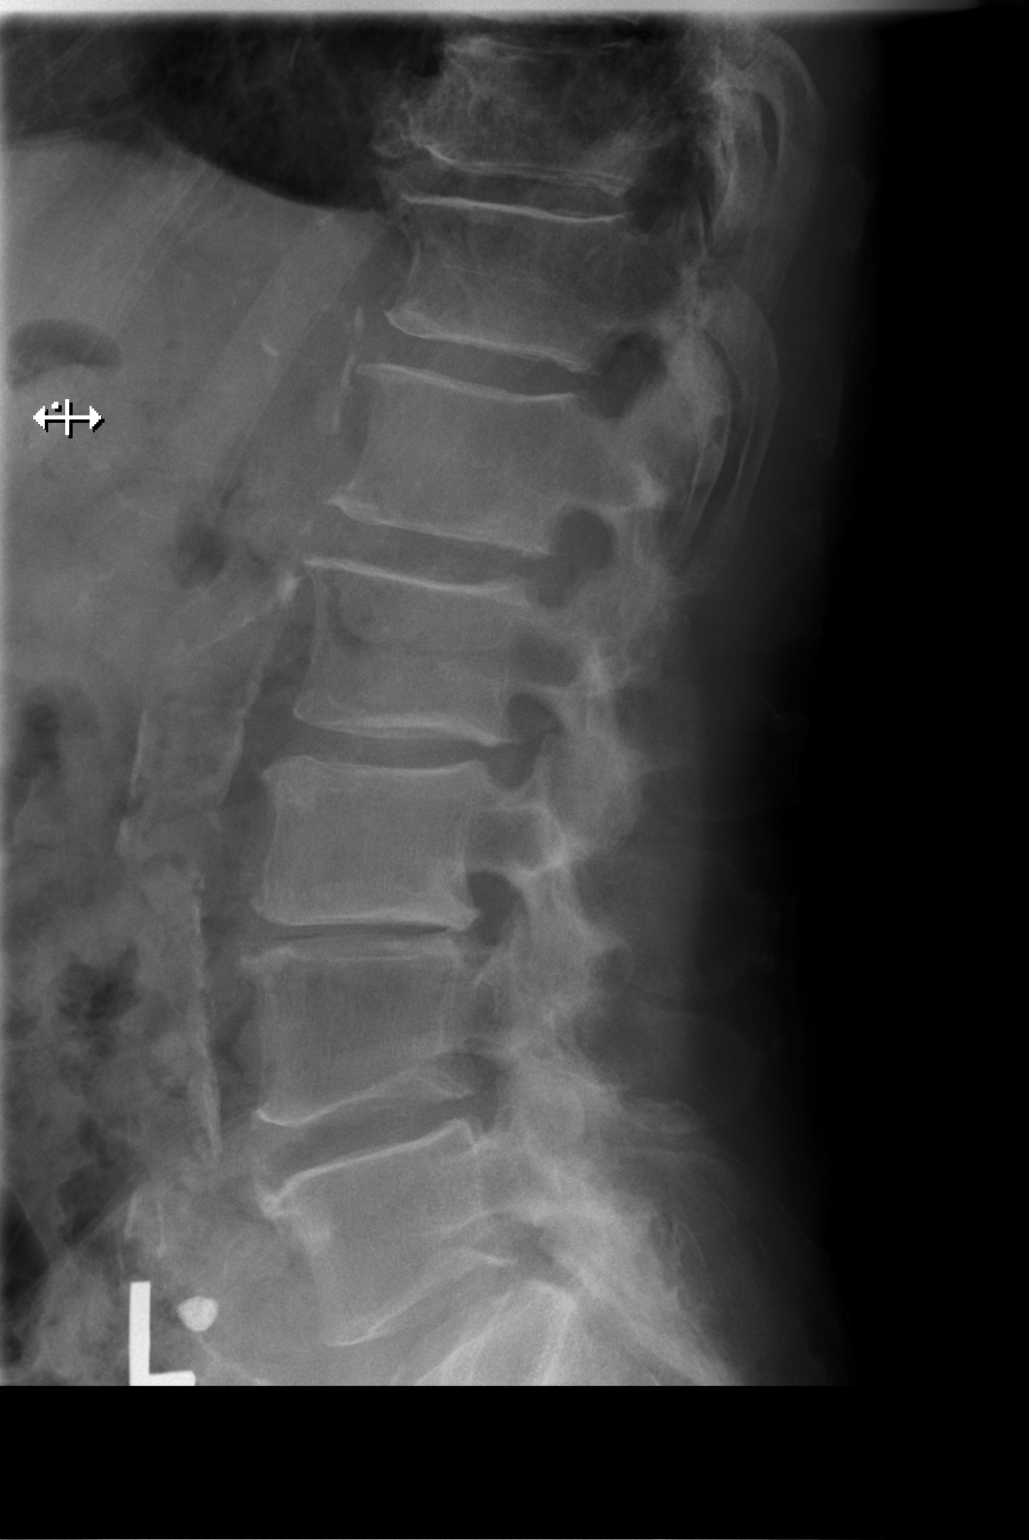
[im 3/3]
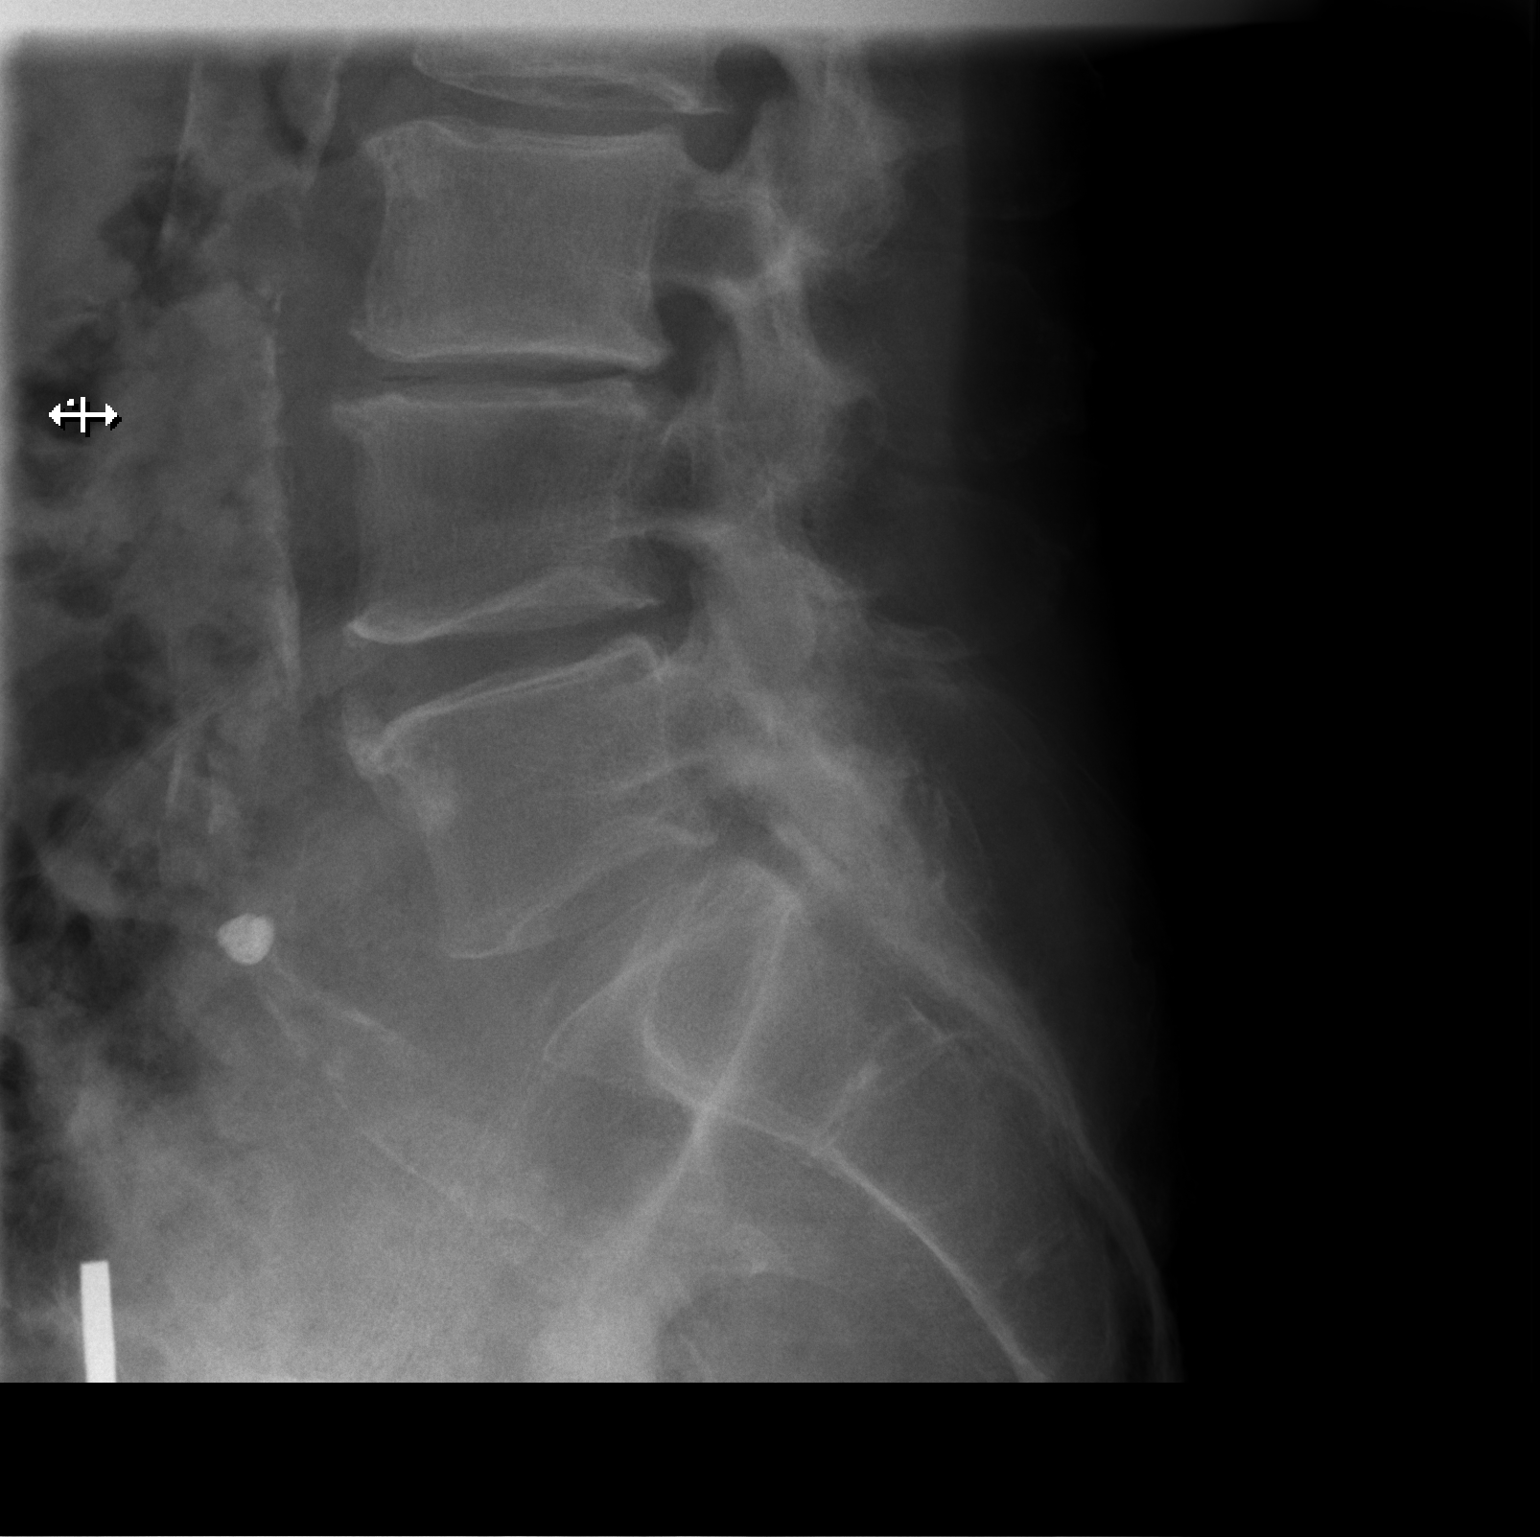

[3 of 3 positions shown; findings below may reference images not displayed]

FINDINGS: No fracture or significant spondylolisthesis is noted. Mild
degenerative disc disease is noted at L2-3. Severe degenerative disc
disease is noted at L3-4. Atherosclerotic calcifications of
abdominal aorta are noted.
IMPRESSION: Multilevel degenerative disc disease. No acute abnormality seen in
the lumbar spine.

## 2014-09-17 IMAGING — CT CT HEAD WITHOUT CONTRAST
1 series · 15 of 30 positions shown, 19 images · non-contrast
Comparison: 06/28/2013

CLINICAL DATA: Status post fall, altered mental status

EXAM:
CT HEAD WITHOUT CONTRAST
TECHNIQUE: Contiguous axial images were obtained from the base of the skull
through the vertex without intravenous contrast.

[Series 2: head wo · axial · 0.44mm/px · z∈[-204,-78]mm · 15 of 32 slices shown, 19 images]
[im 2/32  brain]
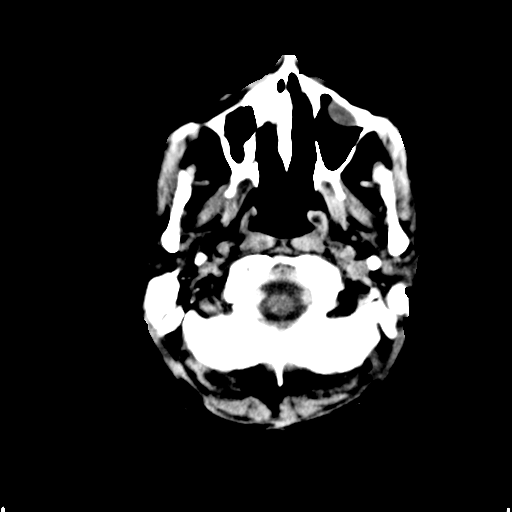
[im 2/32  bone]
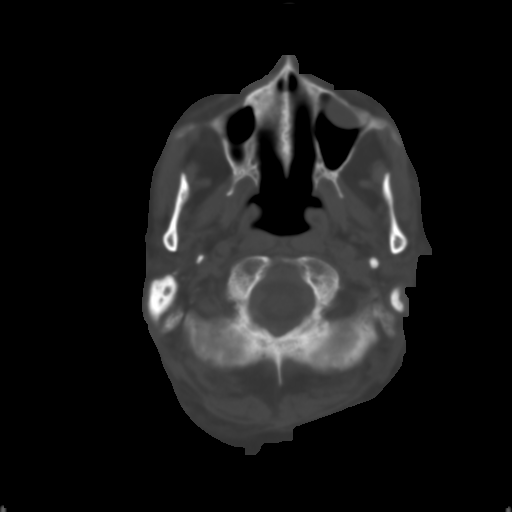
[im 4/32  brain]
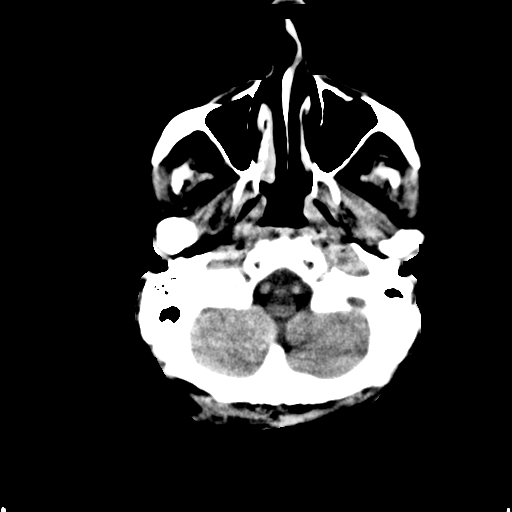
[im 6/32  brain]
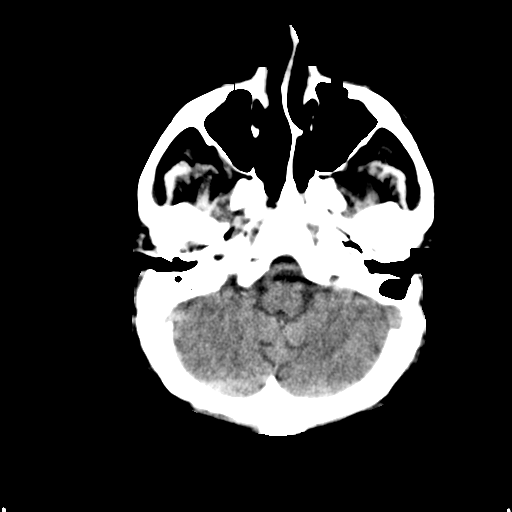
[im 8/32  brain]
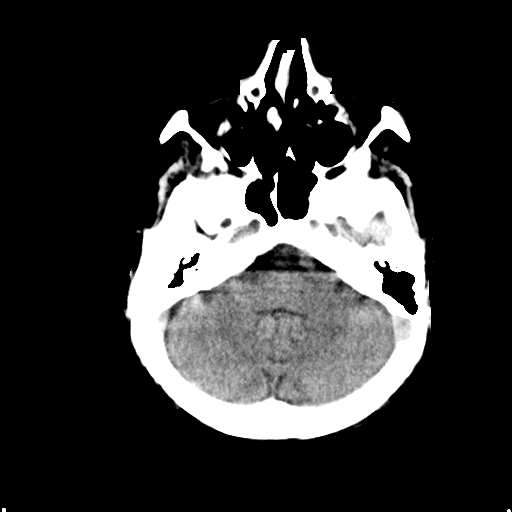
[im 10/32  brain]
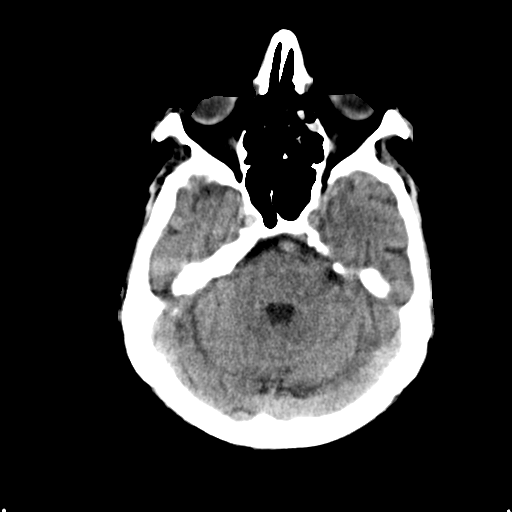
[im 10/32  bone]
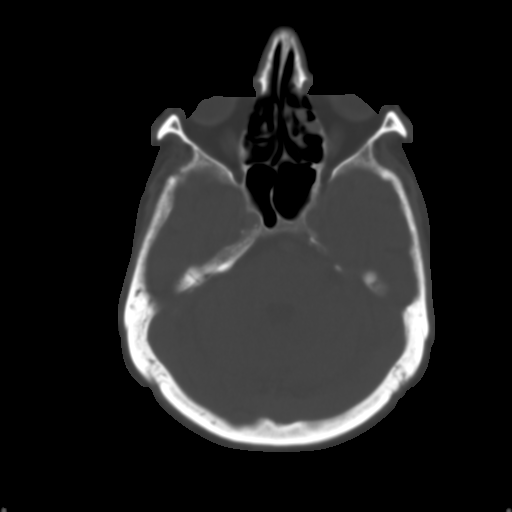
[im 12/32  brain]
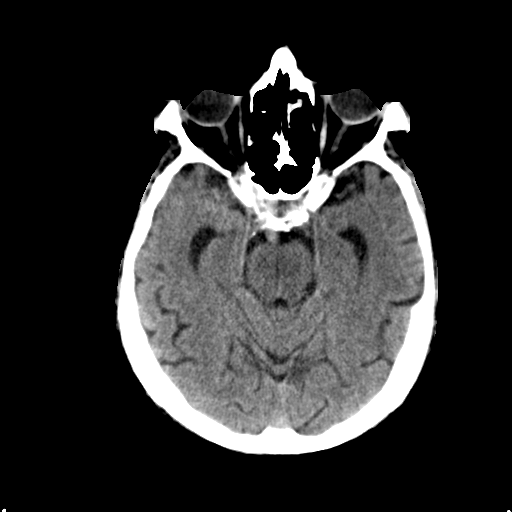
[im 14/32  brain]
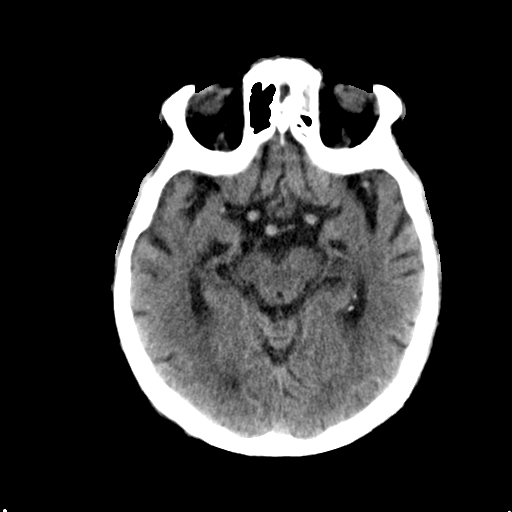
[im 17/32  brain]
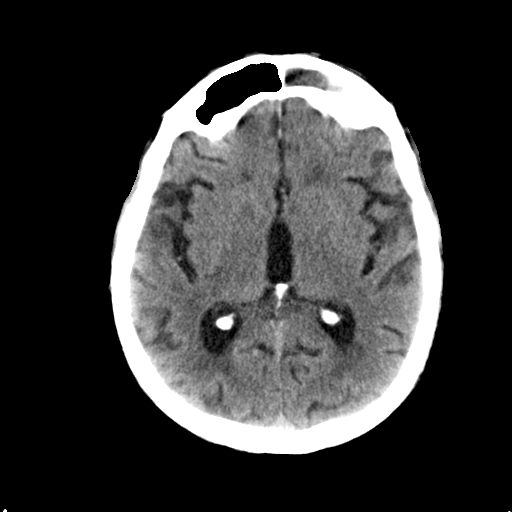
[im 18/32  brain]
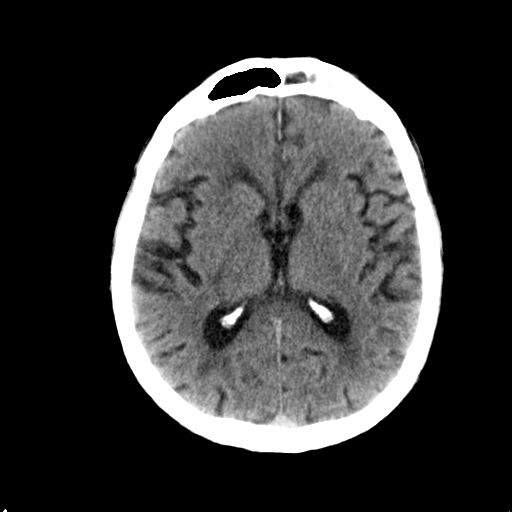
[im 18/32  bone]
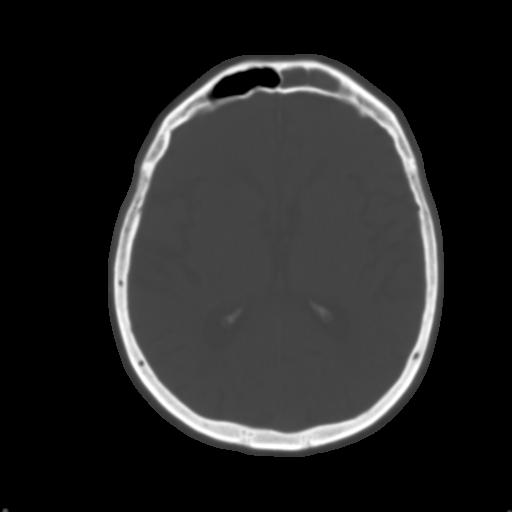
[im 20/32  brain]
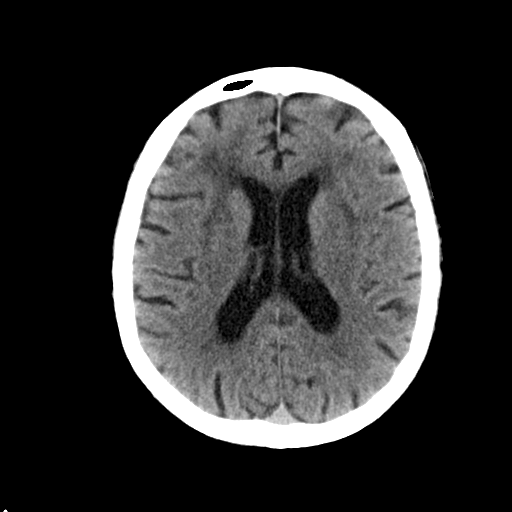
[im 22/32  brain]
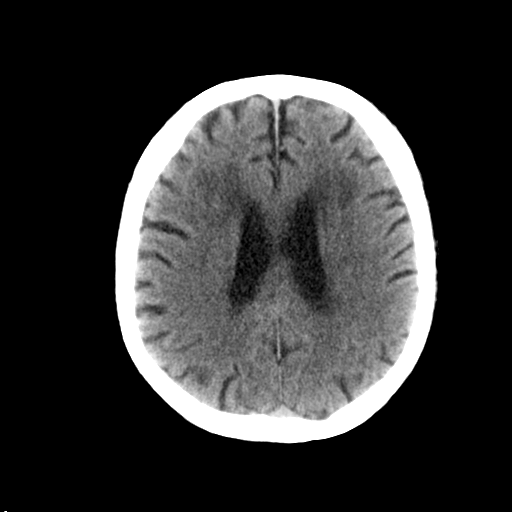
[im 24/32  brain]
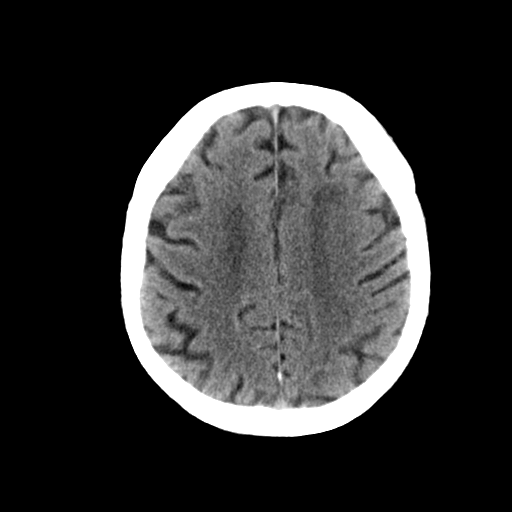
[im 26/32  brain]
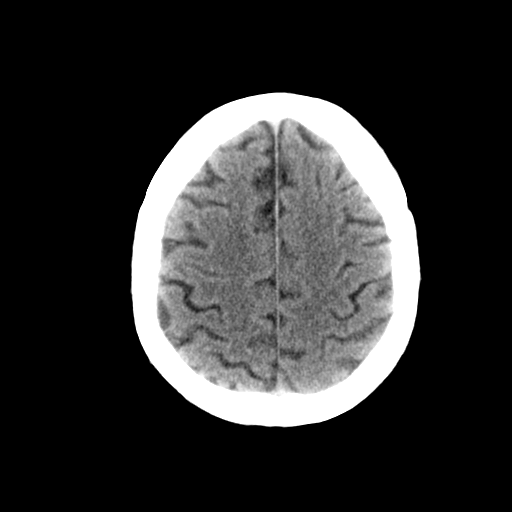
[im 26/32  bone]
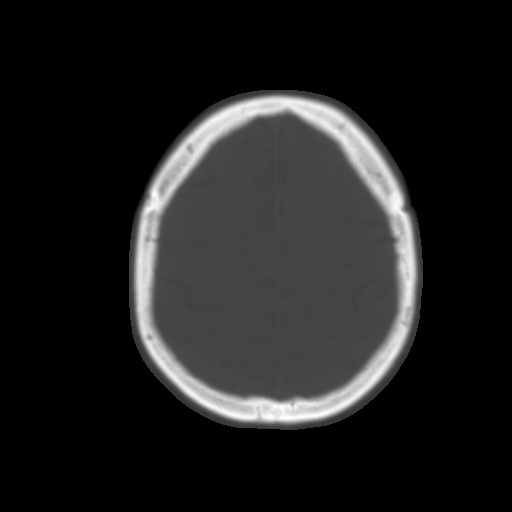
[im 28/32  brain]
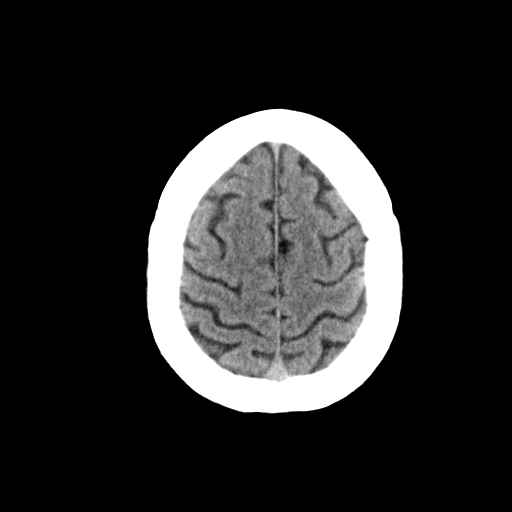
[im 30/32  brain]
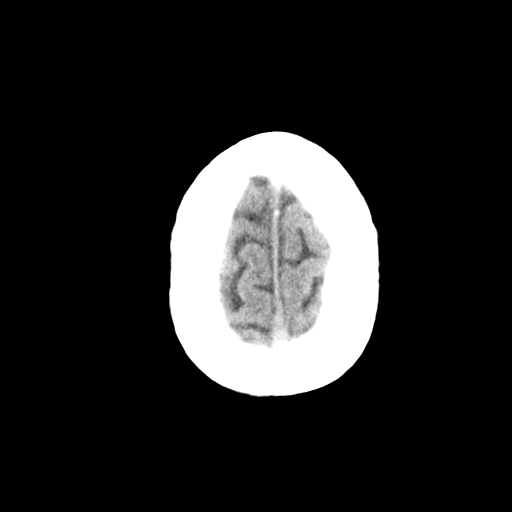

[15 of 30 positions shown; findings below may reference images not displayed]

FINDINGS: There is no evidence of mass effect, midline shift, or extra-axial
fluid collections. There is no evidence of a space-occupying lesion
or intracranial hemorrhage. There is no evidence of a cortical-based
area of acute infarction. There is generalized cerebral atrophy.
There is periventricular white matter low attenuation likely
secondary to microangiopathy.

The ventricles and sulci are appropriate for the patient's age. The
basal cisterns are patent.

Visualized portions of the orbits are unremarkable. There is
complete opacification of the left frontal sinus and left
frontoethmoidal recess. There is mild mucosal thickening of
bilateral ethmoid sinuses. There is a small left maxillary sinus
mucous retention cyst.

The osseous structures are unremarkable.
IMPRESSION: 1. No acute intracranial pathology.
2. Sinus disease most severe in the left frontal sinus as described
above.
